# Patient Record
Sex: Male | Born: 1955 | Race: White | Hispanic: No | Marital: Married | Smoking: Never smoker
Health system: Southern US, Community
[De-identification: ages and names within clinical notes are randomized; demographics above are authoritative.]

## PROBLEM LIST (undated history)

## (undated) DIAGNOSIS — F32A Depression, unspecified: Secondary | ICD-10-CM

## (undated) DIAGNOSIS — I7121 Aneurysm of the ascending aorta, without rupture: Secondary | ICD-10-CM

## (undated) DIAGNOSIS — R1032 Left lower quadrant pain: Secondary | ICD-10-CM

## (undated) DIAGNOSIS — F419 Anxiety disorder, unspecified: Secondary | ICD-10-CM

## (undated) DIAGNOSIS — I350 Nonrheumatic aortic (valve) stenosis: Secondary | ICD-10-CM

## (undated) DIAGNOSIS — R972 Elevated prostate specific antigen [PSA]: Secondary | ICD-10-CM

## (undated) DIAGNOSIS — R351 Nocturia: Secondary | ICD-10-CM

## (undated) DIAGNOSIS — Q6589 Other specified congenital deformities of hip: Secondary | ICD-10-CM

## (undated) DIAGNOSIS — M542 Cervicalgia: Secondary | ICD-10-CM

## (undated) DIAGNOSIS — E785 Hyperlipidemia, unspecified: Secondary | ICD-10-CM

## (undated) DIAGNOSIS — E7800 Pure hypercholesterolemia, unspecified: Secondary | ICD-10-CM

## (undated) DIAGNOSIS — D369 Benign neoplasm, unspecified site: Secondary | ICD-10-CM

## (undated) DIAGNOSIS — M1990 Unspecified osteoarthritis, unspecified site: Secondary | ICD-10-CM

## (undated) DIAGNOSIS — I712 Thoracic aortic aneurysm, without rupture: Secondary | ICD-10-CM

## (undated) DIAGNOSIS — L659 Nonscarring hair loss, unspecified: Secondary | ICD-10-CM

## (undated) DIAGNOSIS — R519 Headache, unspecified: Secondary | ICD-10-CM

## (undated) DIAGNOSIS — F329 Major depressive disorder, single episode, unspecified: Secondary | ICD-10-CM

## (undated) DIAGNOSIS — C61 Malignant neoplasm of prostate: Secondary | ICD-10-CM

## (undated) HISTORY — DX: Hyperlipidemia, unspecified: E78.5

## (undated) HISTORY — DX: Nocturia: R35.1

## (undated) HISTORY — PX: WISDOM TOOTH EXTRACTION: SHX21

## (undated) HISTORY — DX: Thoracic aortic aneurysm, without rupture: I71.2

## (undated) HISTORY — DX: Depression, unspecified: F32.A

## (undated) HISTORY — DX: Cervicalgia: M54.2

## (undated) HISTORY — PX: OTHER SURGICAL HISTORY: SHX169

## (undated) HISTORY — DX: Nonrheumatic aortic (valve) stenosis: I35.0

## (undated) HISTORY — DX: Pure hypercholesterolemia, unspecified: E78.00

## (undated) HISTORY — DX: Anxiety disorder, unspecified: F41.9

## (undated) HISTORY — DX: Headache, unspecified: R51.9

## (undated) HISTORY — DX: Left lower quadrant pain: R10.32

## (undated) HISTORY — DX: Unspecified osteoarthritis, unspecified site: M19.90

## (undated) HISTORY — DX: Nonscarring hair loss, unspecified: L65.9

## (undated) HISTORY — DX: Other specified congenital deformities of hip: Q65.89

## (undated) HISTORY — PX: TONSILLECTOMY: SUR1361

## (undated) HISTORY — PX: CERVICAL FUSION: SHX112

## (undated) HISTORY — DX: Aneurysm of the ascending aorta, without rupture: I71.21

## (undated) HISTORY — DX: Benign neoplasm, unspecified site: D36.9

## (undated) HISTORY — PX: SEPTOPLASTY: SUR1290

## (undated) HISTORY — PX: RHINOPLASTY: SUR1284

---
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
## 2007-05-06 ENCOUNTER — Encounter: Admission: RE | Admit: 2007-05-06 | Discharge: 2007-05-06 | Payer: Self-pay | Admitting: Gastroenterology

## 2007-05-12 ENCOUNTER — Ambulatory Visit (HOSPITAL_COMMUNITY): Admission: RE | Admit: 2007-05-12 | Discharge: 2007-05-13 | Payer: Self-pay | Admitting: Neurosurgery

## 2009-08-17 IMAGING — CR DG CERVICAL SPINE 2 OR 3 VIEWS
1 series · 1 of 1 positions shown · non-contrast
Comparison: Cervical spine MRI 05/06/07.

CLINICAL DATA: C6-7 ACDF.
 PORTABLE CERVICAL SPINE ? 2 VIEWS ? 05/12/07:

[view not recorded]
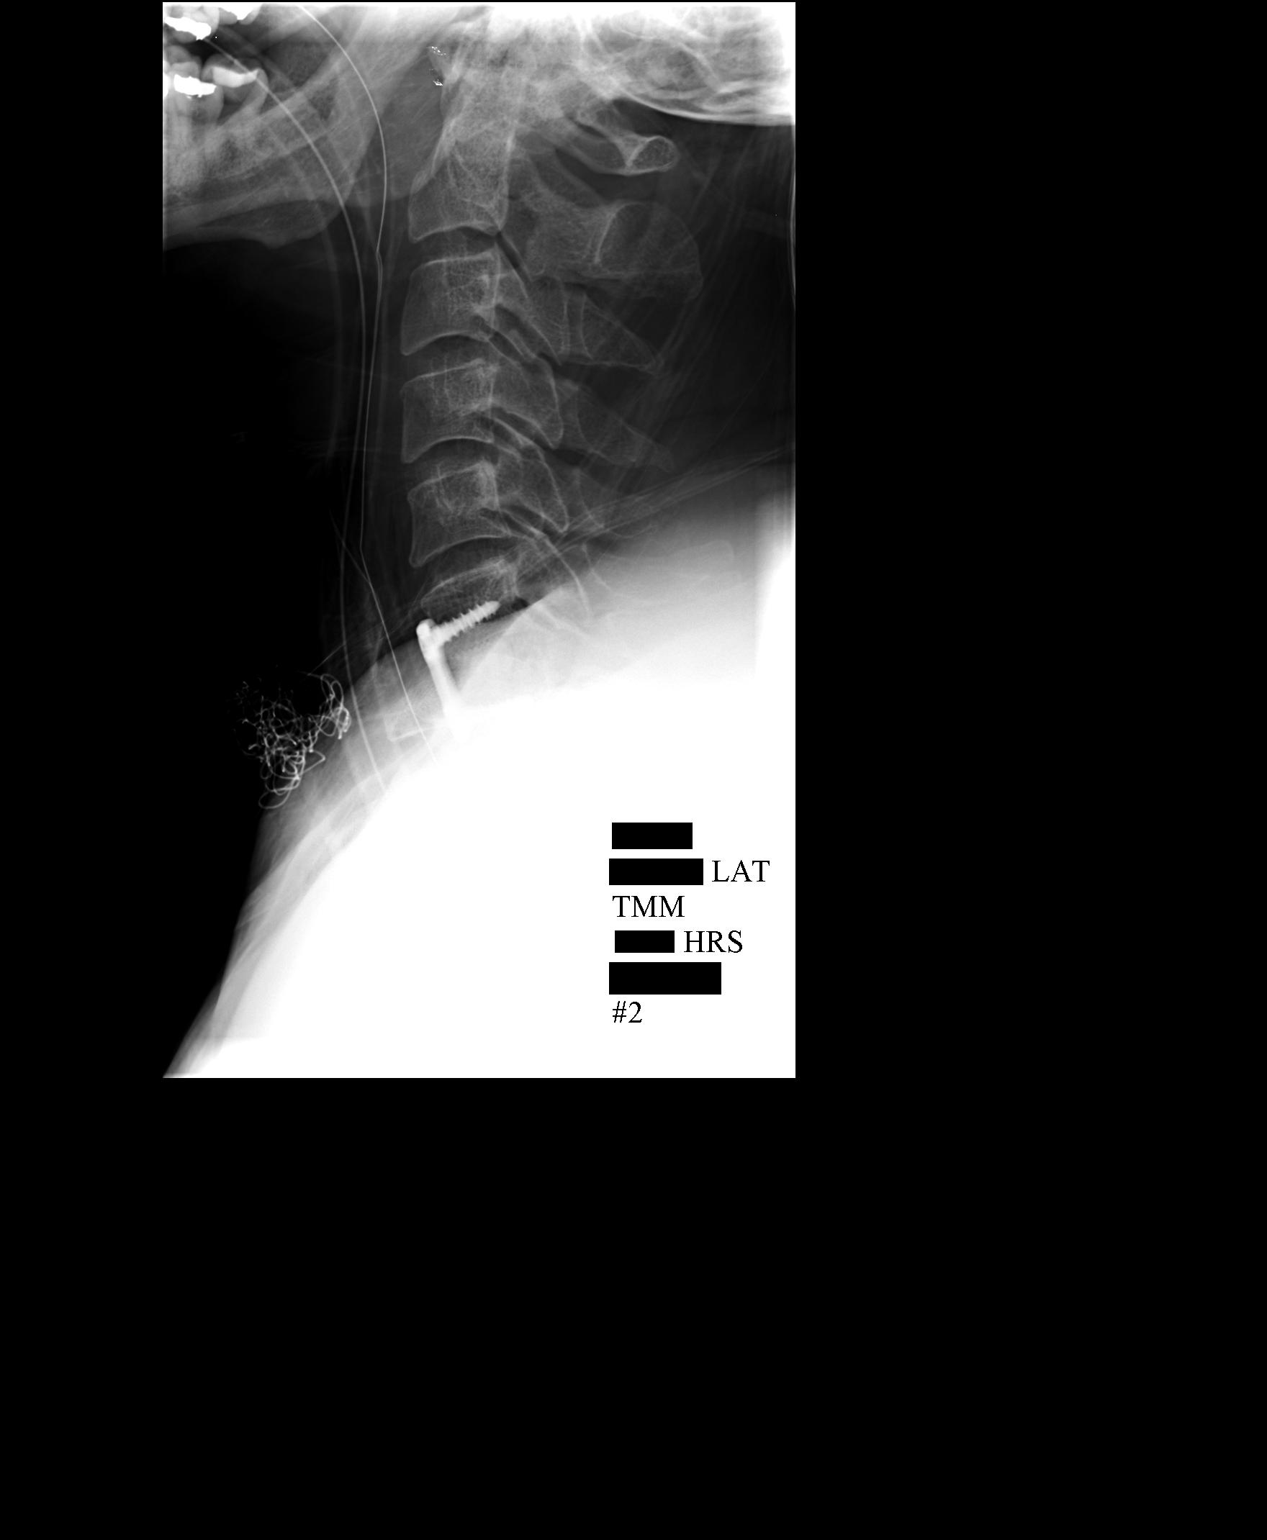

[1 of 1 positions shown; findings below may reference images not displayed]

FINDINGS: Two intraoperative cross-table lateral views of the cervical spine are submitted postoperatively from the operating room.  The initial view demonstrates an anterior localizing needle at the C6-C7 level.  On the second view, the patient has undergone interval anterior diskectomy and fusion at C6-7 with an anterior plate and screws and intervertebral bone plug.  The hardware appears well positioned.  No complications are evident.
IMPRESSION: Intraoperative views during C6-7 ACDF as described.

## 2010-12-05 NOTE — Op Note (Signed)
NAME:  Micheal Powell, Micheal Powell NO.:  [id number]   MEDICAL RECORD NO.:  [id number]          PATIENT TYPE:  AMB   LOCATION:  SDS                          FACILITY:  [facility]   PHYSICIAN:  Clydene Fake, M.D.  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   PROCEDURE:  Anterior cervical decompression and diskectomy and fusion at  C6-7 with __________ allograft bone and Trestle anterior cervical plate.   SURGEON:  Clydene Fake, M.D.   ASSISTANT:  Hewitt Shorts, M.D.   ANESTHESIA:  General endotracheal tube anesthesia.   ESTIMATED BLOOD LOSS:  Minimal.   BLOOD GIVEN:  None.   DRAINS:  None.   COMPLICATIONS:  None.   INDICATIONS FOR PROCEDURE:  The patient is a 55 year old gentleman who  started with neck pain which progressed to severe left shoulder and arm  pain, numbness and weakness radiating into the middle fingers, with  weakness in the triceps despite being on prednisone.  MRI was done, and  the patient had a very large disk herniation on the left side at C6-7,  central and lateral foramen compressing lateral left side of the cord  and C7 nerve root.  The patient brought in for decompression.   PROCEDURE IN DETAIL:  The patient was brought into the operating room.  General anesthesia induced.  The patient was placed in 10 pounds of  halter traction and prepped and draped in a sterile fashion.  The site  of the incision was injected with 10 mL of 1% lidocaine with  epinephrine.   The incision was then made from the midline to the anterior border of  the sternocleidomastoid muscle on the left side.  Neck incision taken  down to the platysma and hemostasis obtained  with the Bovie.  The  platysma was opened with the Bovie and blunt dissection taken through  the anterior cervical fascia to the anterior cervical spine.  A needle  was placed in the interspace.  X-rays were obtained showing the 6/7  interspace.  The disk space was incised with a 15 blade and partial  diskectomy performed.  As the needle was removed, the longus coli  muscles were reflected laterally on each side using the Bovie.  Self-  retaining retractor was then placed.  The microscope was brought in for  microdissection at this point, and curettes and 1 and 2-mm Kerrison  punches were used to continue the diskectomy, removing the posterior  ligament and posterior disk herniation.  There was a significant amount  of free fragments of disk ruptured past the ligament compressing the  cord and out the left foramen.  These were removed with the Kerrison  punches and fished out with the nerve hooks.  We were finished, we had  good decompression of thecal sac and bilateral nerve roots, and they  were both decompressed  out fairly laterally, especially in the left  side.  Hemostasis was obtained with Gelfoam and thrombin.  This was  irrigated out.  We used the high-speed drill to remove cartilaginous  endplate to measure the head of the disk space to be 6 mm.  We again had  good hemostasis, irrigated with antibiotic solution, and a 6-mm  __________ allograft bone was tapped into place and countersunk a couple  of millimeters.  We checked posterior to the graft with a nerve hook,  and there was plenty of room between the bone graft and dura.  We had  placed a Trestle anterior cervical plate over the anterior cervical  spine and placed to two screws into C6 and two into C7.  These were  tightened down.  Lateral x-rays were obtained showing good position of  the plate, screws, bone plug at 6-7 level.  The retractors were removed.  Hemostasis was obtained with Gelfoam and thrombin and bipolar   cauterization.  Gelfoam was irrigated out.  After complete hemostasis,  the platysma was closed with 3-0 Vicryl interrupted sutures.  The  subcutaneous tissues were closed with the same.  The skin was closed  with benzoin and Steri-Strips, and a dressing was placed.   The patient was placed into a soft cervical collar, awakened from  anesthesia, and transferred to the recovery room in stable condition.           ______________________________  Clydene Fake, M.D.     JRH/MEDQ  D:  05/12/2007  T:  05/13/2007  Job:  [id number]

## 2011-05-02 LAB — URINALYSIS, ROUTINE W REFLEX MICROSCOPIC
Glucose, UA: NEGATIVE
Hgb urine dipstick: NEGATIVE
Protein, ur: NEGATIVE
Specific Gravity, Urine: 1.012

## 2011-05-02 LAB — BASIC METABOLIC PANEL
Calcium: 9.9
Creatinine, Ser: 0.89
GFR calc Af Amer: >60
Sodium: 138

## 2011-05-02 LAB — CBC
HCT: 45.7
Hemoglobin: 15.6
MCV: 90.4
RBC: 5.06
WBC: 21.1 — ABNORMAL HIGH

## 2012-07-19 ENCOUNTER — Ambulatory Visit (INDEPENDENT_AMBULATORY_CARE_PROVIDER_SITE_OTHER): Payer: BC Managed Care – PPO | Admitting: Radiology

## 2012-07-19 DIAGNOSIS — Z23 Encounter for immunization: Secondary | ICD-10-CM

## 2013-06-20 ENCOUNTER — Ambulatory Visit (INDEPENDENT_AMBULATORY_CARE_PROVIDER_SITE_OTHER): Payer: BC Managed Care – PPO | Admitting: *Deleted

## 2013-06-20 DIAGNOSIS — Z23 Encounter for immunization: Secondary | ICD-10-CM

## 2013-12-02 ENCOUNTER — Ambulatory Visit (INDEPENDENT_AMBULATORY_CARE_PROVIDER_SITE_OTHER): Payer: BC Managed Care – PPO | Admitting: Emergency Medicine

## 2013-12-02 VITALS — BP 122/80 | HR 74 | Temp 98.6°F | Resp 18 | Ht 74.0 in | Wt 204.0 lb

## 2013-12-02 DIAGNOSIS — J018 Other acute sinusitis: Secondary | ICD-10-CM

## 2013-12-02 DIAGNOSIS — J209 Acute bronchitis, unspecified: Secondary | ICD-10-CM

## 2013-12-02 MED ORDER — AMOXICILLIN-POT CLAVULANATE 875-125 MG PO TABS: 1.0000 | ORAL_TABLET | Freq: Two times a day (BID) | ORAL | Status: DC

## 2013-12-02 MED ORDER — PROMETHAZINE-CODEINE 6.25-10 MG/5ML PO SYRP: 5.0000 mL | ORAL_SOLUTION | Freq: Four times a day (QID) | ORAL | Status: DC | PRN

## 2013-12-02 MED ORDER — PSEUDOEPHEDRINE-GUAIFENESIN ER 60-600 MG PO TB12
1.0000 | ORAL_TABLET | Freq: Two times a day (BID) | ORAL | Status: DC
Start: 2013-12-02 — End: 2014-10-21

## 2013-12-02 NOTE — Progress Notes (Signed)
[facility] [address], [place] [address] [phone]  Date:  12/02/2013   Name:  Micheal Powell   DOB:  21-Feb-1956   MRN:  [id number]  PCP:  No PCP Per Patient    Chief Complaint: Sinusitis, Cough, Headache and Nasal Congestion   History of Present Illness:  Micheal Powell is a 58 y.o. very pleasant male patient who presents with the following:  Ill since last night.  Had fever and pressure in cheeks and forehead.  Has post nasal drainage and cough productive of brown sputum.  No wheezing or shortness of breath.  Headache.  Some nasal congestion with little nasal discharge.  No improvement with over the counter medications or other home remedies. Denies other complaint or health concern today.   There are no active problems to display for this patient.   History reviewed. No pertinent past medical history.  History reviewed. No pertinent past surgical history.  History  Substance Use Topics  . Smoking status: Never Smoker   . Smokeless tobacco: Not on file  . Alcohol Use: Not on file    History reviewed. No pertinent family history.  No Known Allergies  Medication list has been reviewed and updated.  No current outpatient prescriptions on file prior to visit.   No current facility-administered medications on file prior to visit.    Review of Systems:  As per HPI, otherwise negative.    Physical Examination: Filed Vitals:   12/02/13 1133  BP: 122/80  Pulse: 74  Temp: 98.6 F (37 C)  Resp: 18   Filed Vitals:   12/02/13 1133  Height: 6\' 2"  (1.88 m)  Weight: 204 lb (92.534 kg)   Body mass index is 26.18 kg/(m^2). Ideal Body Weight: Weight in (lb) to have BMI = 25: 194.3  GEN: WDWN, NAD, Non-toxic, A & O x 3 HEENT: Atraumatic, Normocephalic. Neck supple. No masses, No LAD. Ears and Nose: No external deformity. CV: RRR, No M/G/R. No JVD. No thrill. No extra heart sounds. PULM: CTA B, no wheezes, crackles, rhonchi. No retractions.  No resp. distress. No accessory muscle use. ABD: S, NT, ND, +BS. No rebound. No HSM. EXTR: No c/c/e NEURO Normal gait.  PSYCH: Normally interactive. Conversant. Not depressed or anxious appearing.  Calm demeanor.    Assessment and Plan: Sinusitis Bronchitis augmentin mucinex d Phen c cod  Signed,  Ellison Carwin, MD

## 2013-12-02 NOTE — Patient Instructions (Signed)

## 2014-10-21 ENCOUNTER — Ambulatory Visit (INDEPENDENT_AMBULATORY_CARE_PROVIDER_SITE_OTHER): Payer: BLUE CROSS/BLUE SHIELD | Admitting: Urgent Care

## 2014-10-21 VITALS — BP 122/68 | HR 72 | Temp 97.7°F | Resp 17 | Ht 73.5 in | Wt 206.8 lb

## 2014-10-21 DIAGNOSIS — M79675 Pain in left toe(s): Secondary | ICD-10-CM | POA: Diagnosis not present

## 2014-10-21 DIAGNOSIS — M2042 Other hammer toe(s) (acquired), left foot: Secondary | ICD-10-CM | POA: Diagnosis not present

## 2014-10-21 DIAGNOSIS — L03032 Cellulitis of left toe: Secondary | ICD-10-CM

## 2014-10-21 MED ORDER — CEPHALEXIN 500 MG PO CAPS
500.0000 mg | ORAL_CAPSULE | Freq: Two times a day (BID) | ORAL | Status: AC
Start: 2014-10-21 — End: 2014-10-28

## 2014-10-21 NOTE — Progress Notes (Signed)
    MRN: [id number] DOB: [birth year]  Subjective:   Micheal Powell is a 59 y.o. male presenting for chief complaint of Toe Pain  Reports 1 week history of worsening 4th left toe pain. Patient reports pain is moderate in severity, achy in nature, worse with walking. Pain does not radiate but has had mild redness and swelling. He has tried Alleve intermittently, also uses this for arthritis; has had some relief with this. Patient admits he is worried this is an ingrown toe nail and is about to travel for about 1 week, would hate for this to get worse. Denies fevers, pus, bleeding, trauma, decreased ROM, decreased sensation. Denies any other aggravating or relieving factors, no other questions or concerns.  Kelsen takes Alleve as needed for arthritis. He has No Known Allergies.  [name]  has a past medical history of Arthritis. Also  has no past surgical history on file.  ROS As in subjective.  Objective:   Vitals: BP 122/68 mmHg  Pulse 72  Temp(Src) 97.7 F (36.5 C) (Oral)  Resp 17  Ht 6' 1.5" (1.867 m)  Wt 206 lb 12.8 oz (93.804 kg)  BMI 26.91 kg/m2  SpO2 99%  Physical Exam  Constitutional: He is oriented to person, place, and time and well-developed, well-nourished, and in no distress.  Cardiovascular: Normal rate.   Pulmonary/Chest: Effort normal.  Musculoskeletal:       Left ankle: He exhibits swelling (trace edema over 4th left toe over lateral border of nail) and deformity (hammer toe of left 4th toe, distal lateral toe actually faces dorsum of foot). He exhibits normal range of motion, no ecchymosis, no laceration and normal pulse. Tenderness (exquisite tenderness of lateral distal aspect of 4th toe).  Neurological: He is alert and oriented to person, place, and time.  Skin: Skin is warm and dry. No rash noted. No erythema. No pallor.   Assessment and Plan :   1. Toe pain, left 2. Hammer toe, left 3. Paronychia, toe, left - Will cover for possible infection, start Keflex BID  x7 days, warm compresses, Tylenol or Alleve for pain - If no improvement in 1 week, call and I will refer to podiatry  Jaynee Eagles, PA-C [facility] [phone] 10/21/2014 10:13 PM

## 2014-10-21 NOTE — Patient Instructions (Addendum)
-   Please take Tylenol or Alleve for your toe pain. If this does not help, I can prescribe Ultram for toe pain - you would have to return to clinic to pick up a script as this is a controlled substance. - If your toe pain does not resolve with warm compresses, antibiotic course, call me and let me know so we can refer you to podiatry for evaluation of hammer toe.  Hammer Toes Hammer toes is a condition in which one or more of your toes is permanently flexed. CAUSES  This happens when a muscle imbalance or abnormal bone length makes your small toes buckle. This causes the toe joint to contract and the strong cord-like bands that attach muscles to the bones (tendons) in your toes to shorten.  SIGNS AND SYMPTOMS  Common symptoms of flexible hammer toes include:   A buildup of skin cells (corns). Corns occur where boney bumps come in frequent contact with hard surfaces. For example, where your shoes press and rub.  Irritation.  Inflammation.  Pain.  Limited motion in your toes. DIAGNOSIS  Hammer toes are diagnosed through a physical exam of your toes. During the exam, your health care provider may try to reproduce your symptoms by manipulating your foot. Often, X-ray exams are done to determine the degree of deformity and to make sure that the cause is not a fracture.  TREATMENT  Hammer toes can be treated with corrective surgery. There are several types of surgical procedures that can treat hammer toes. The most common procedures include:  Arthroplasty--A portion of the joint is surgically removed and your toe is straightened. The gap fills in with fibrous tissue. This procedure helps treat pain and deformity and helps restore function.  Fusion--Cartilage between the two bones of the affected joint is taken out and the bones fuse together into one longer bone. This helps keep your toe stable and reduces pain but leaves your toe stiff, yet straight.  Implantation--A portion of your bone is  removed and replaced with an implant to restore motion.  Flexor tendon transfers--This procedure repositions the tendons that curl the toes down (flexor tendons). This may be done to release the deforming force that causes your toe to buckle. Several of these procedures require fixing your toe with a pin that is visible at the tip of your toe. The pin keeps the toe straight during healing. Your health care provider will remove the pin usually within 4-8 weeks after the procedure.  Document Released: 07/06/2000 Document Revised: 07/14/2013 Document Reviewed: 03/16/2013 [facility] Patient Information 2015 [place], [place]. This information is not intended to replace advice given to you by your health care provider. Make sure you discuss any questions you have with your health care provider.

## 2014-10-21 NOTE — Addendum Note (Signed)
Addended by: Venetia Night on: 10/21/2014 11:14 PM   Modules accepted: Orders

## 2014-10-24 LAB — WOUND CULTURE
Gram Stain: NONE SEEN
Organism ID, Bacteria: NO GROWTH

## 2014-10-25 ENCOUNTER — Encounter: Payer: Self-pay | Admitting: Urgent Care

## 2016-02-21 DIAGNOSIS — L720 Epidermal cyst: Secondary | ICD-10-CM | POA: Diagnosis not present

## 2016-09-25 DIAGNOSIS — Z0000 Encounter for general adult medical examination without abnormal findings: Secondary | ICD-10-CM | POA: Diagnosis not present

## 2016-09-25 DIAGNOSIS — E7800 Pure hypercholesterolemia, unspecified: Secondary | ICD-10-CM | POA: Diagnosis not present

## 2016-09-25 DIAGNOSIS — Z125 Encounter for screening for malignant neoplasm of prostate: Secondary | ICD-10-CM | POA: Diagnosis not present

## 2016-10-15 DIAGNOSIS — L119 Acantholytic disorder, unspecified: Secondary | ICD-10-CM | POA: Diagnosis not present

## 2016-10-15 DIAGNOSIS — D485 Neoplasm of uncertain behavior of skin: Secondary | ICD-10-CM | POA: Diagnosis not present

## 2016-10-15 DIAGNOSIS — L821 Other seborrheic keratosis: Secondary | ICD-10-CM | POA: Diagnosis not present

## 2016-10-15 DIAGNOSIS — D225 Melanocytic nevi of trunk: Secondary | ICD-10-CM | POA: Diagnosis not present

## 2016-10-15 DIAGNOSIS — D2261 Melanocytic nevi of right upper limb, including shoulder: Secondary | ICD-10-CM | POA: Diagnosis not present

## 2016-10-15 DIAGNOSIS — D2272 Melanocytic nevi of left lower limb, including hip: Secondary | ICD-10-CM | POA: Diagnosis not present

## 2016-11-26 DIAGNOSIS — H43811 Vitreous degeneration, right eye: Secondary | ICD-10-CM | POA: Diagnosis not present

## 2016-12-06 DIAGNOSIS — K644 Residual hemorrhoidal skin tags: Secondary | ICD-10-CM | POA: Diagnosis not present

## 2016-12-06 DIAGNOSIS — Z1210 Encounter for screening for malignant neoplasm of intestinal tract, unspecified: Secondary | ICD-10-CM | POA: Diagnosis not present

## 2016-12-20 DIAGNOSIS — D126 Benign neoplasm of colon, unspecified: Secondary | ICD-10-CM | POA: Diagnosis not present

## 2016-12-20 DIAGNOSIS — Z1211 Encounter for screening for malignant neoplasm of colon: Secondary | ICD-10-CM | POA: Diagnosis not present

## 2016-12-24 DIAGNOSIS — H5203 Hypermetropia, bilateral: Secondary | ICD-10-CM | POA: Diagnosis not present

## 2016-12-24 DIAGNOSIS — H43813 Vitreous degeneration, bilateral: Secondary | ICD-10-CM | POA: Diagnosis not present

## 2016-12-25 DIAGNOSIS — D126 Benign neoplasm of colon, unspecified: Secondary | ICD-10-CM | POA: Diagnosis not present

## 2017-01-11 DIAGNOSIS — H02844 Edema of left upper eyelid: Secondary | ICD-10-CM | POA: Diagnosis not present

## 2017-01-11 DIAGNOSIS — H02845 Edema of left lower eyelid: Secondary | ICD-10-CM | POA: Diagnosis not present

## 2017-01-22 DIAGNOSIS — L309 Dermatitis, unspecified: Secondary | ICD-10-CM | POA: Diagnosis not present

## 2017-02-05 DIAGNOSIS — H10413 Chronic giant papillary conjunctivitis, bilateral: Secondary | ICD-10-CM | POA: Diagnosis not present

## 2017-03-12 DIAGNOSIS — H10413 Chronic giant papillary conjunctivitis, bilateral: Secondary | ICD-10-CM | POA: Diagnosis not present

## 2017-05-15 DIAGNOSIS — Z23 Encounter for immunization: Secondary | ICD-10-CM | POA: Diagnosis not present

## 2017-10-03 DIAGNOSIS — Z131 Encounter for screening for diabetes mellitus: Secondary | ICD-10-CM | POA: Diagnosis not present

## 2017-10-03 DIAGNOSIS — Z0000 Encounter for general adult medical examination without abnormal findings: Secondary | ICD-10-CM | POA: Diagnosis not present

## 2017-10-03 DIAGNOSIS — E7800 Pure hypercholesterolemia, unspecified: Secondary | ICD-10-CM | POA: Diagnosis not present

## 2017-10-17 DIAGNOSIS — L2089 Other atopic dermatitis: Secondary | ICD-10-CM | POA: Diagnosis not present

## 2017-10-17 DIAGNOSIS — L821 Other seborrheic keratosis: Secondary | ICD-10-CM | POA: Diagnosis not present

## 2017-10-17 DIAGNOSIS — D225 Melanocytic nevi of trunk: Secondary | ICD-10-CM | POA: Diagnosis not present

## 2017-10-17 DIAGNOSIS — L720 Epidermal cyst: Secondary | ICD-10-CM | POA: Diagnosis not present

## 2017-12-26 DIAGNOSIS — H2513 Age-related nuclear cataract, bilateral: Secondary | ICD-10-CM | POA: Diagnosis not present

## 2017-12-26 DIAGNOSIS — H5203 Hypermetropia, bilateral: Secondary | ICD-10-CM | POA: Diagnosis not present

## 2018-04-10 DIAGNOSIS — Z23 Encounter for immunization: Secondary | ICD-10-CM | POA: Diagnosis not present

## 2018-10-20 DIAGNOSIS — D225 Melanocytic nevi of trunk: Secondary | ICD-10-CM | POA: Diagnosis not present

## 2018-10-20 DIAGNOSIS — D2262 Melanocytic nevi of left upper limb, including shoulder: Secondary | ICD-10-CM | POA: Diagnosis not present

## 2018-10-20 DIAGNOSIS — D2261 Melanocytic nevi of right upper limb, including shoulder: Secondary | ICD-10-CM | POA: Diagnosis not present

## 2018-10-20 DIAGNOSIS — L821 Other seborrheic keratosis: Secondary | ICD-10-CM | POA: Diagnosis not present

## 2019-01-01 DIAGNOSIS — H2513 Age-related nuclear cataract, bilateral: Secondary | ICD-10-CM | POA: Diagnosis not present

## 2019-01-01 DIAGNOSIS — H04123 Dry eye syndrome of bilateral lacrimal glands: Secondary | ICD-10-CM | POA: Diagnosis not present

## 2019-01-01 DIAGNOSIS — H524 Presbyopia: Secondary | ICD-10-CM | POA: Diagnosis not present

## 2019-01-09 ENCOUNTER — Other Ambulatory Visit (HOSPITAL_COMMUNITY): Payer: Self-pay | Admitting: Internal Medicine

## 2019-01-09 DIAGNOSIS — Z1159 Encounter for screening for other viral diseases: Secondary | ICD-10-CM | POA: Diagnosis not present

## 2019-01-09 DIAGNOSIS — Z23 Encounter for immunization: Secondary | ICD-10-CM | POA: Diagnosis not present

## 2019-01-09 DIAGNOSIS — Z125 Encounter for screening for malignant neoplasm of prostate: Secondary | ICD-10-CM | POA: Diagnosis not present

## 2019-01-09 DIAGNOSIS — R011 Cardiac murmur, unspecified: Secondary | ICD-10-CM | POA: Diagnosis not present

## 2019-01-09 DIAGNOSIS — E7800 Pure hypercholesterolemia, unspecified: Secondary | ICD-10-CM | POA: Diagnosis not present

## 2019-01-09 DIAGNOSIS — Z0000 Encounter for general adult medical examination without abnormal findings: Secondary | ICD-10-CM | POA: Diagnosis not present

## 2019-01-12 ENCOUNTER — Telehealth (HOSPITAL_COMMUNITY): Payer: Self-pay

## 2019-01-12 NOTE — Telephone Encounter (Signed)

## 2019-01-13 ENCOUNTER — Other Ambulatory Visit: Payer: Self-pay

## 2019-01-13 ENCOUNTER — Ambulatory Visit (HOSPITAL_COMMUNITY): Payer: BC Managed Care – PPO | Attending: Internal Medicine

## 2019-01-13 ENCOUNTER — Encounter (INDEPENDENT_AMBULATORY_CARE_PROVIDER_SITE_OTHER): Payer: Self-pay

## 2019-01-13 DIAGNOSIS — R011 Cardiac murmur, unspecified: Secondary | ICD-10-CM | POA: Diagnosis not present

## 2019-01-14 ENCOUNTER — Other Ambulatory Visit: Payer: Self-pay | Admitting: Internal Medicine

## 2019-01-14 DIAGNOSIS — E7800 Pure hypercholesterolemia, unspecified: Secondary | ICD-10-CM

## 2019-01-22 ENCOUNTER — Telehealth: Payer: Self-pay

## 2019-01-22 NOTE — Telephone Encounter (Signed)
NOTES ON FILE FROM [facility] [facility] [phone], SENT REFERRAL TO [facility]

## 2019-01-26 ENCOUNTER — Other Ambulatory Visit (HOSPITAL_COMMUNITY): Payer: Self-pay | Admitting: Internal Medicine

## 2019-01-26 DIAGNOSIS — R011 Cardiac murmur, unspecified: Secondary | ICD-10-CM

## 2019-02-09 ENCOUNTER — Ambulatory Visit
Admission: RE | Admit: 2019-02-09 | Discharge: 2019-02-09 | Disposition: A | Discharge status: Home / Self Care | Payer: No Typology Code available for payment source | Source: Ambulatory Visit | Attending: Internal Medicine | Admitting: Internal Medicine

## 2019-02-09 ENCOUNTER — Other Ambulatory Visit: Payer: Self-pay

## 2019-02-09 DIAGNOSIS — E7800 Pure hypercholesterolemia, unspecified: Secondary | ICD-10-CM

## 2019-03-13 DIAGNOSIS — Z23 Encounter for immunization: Secondary | ICD-10-CM | POA: Diagnosis not present

## 2019-04-14 DIAGNOSIS — E7800 Pure hypercholesterolemia, unspecified: Secondary | ICD-10-CM | POA: Diagnosis not present

## 2019-04-14 DIAGNOSIS — Z5181 Encounter for therapeutic drug level monitoring: Secondary | ICD-10-CM | POA: Diagnosis not present

## 2019-04-22 ENCOUNTER — Ambulatory Visit (INDEPENDENT_AMBULATORY_CARE_PROVIDER_SITE_OTHER): Payer: BC Managed Care – PPO | Admitting: Cardiology

## 2019-04-22 ENCOUNTER — Other Ambulatory Visit: Payer: Self-pay

## 2019-04-22 ENCOUNTER — Encounter: Payer: Self-pay | Admitting: Cardiology

## 2019-04-22 VITALS — BP 122/70 | HR 65 | Ht 73.5 in | Wt 183.2 lb

## 2019-04-22 DIAGNOSIS — Q231 Congenital insufficiency of aortic valve: Secondary | ICD-10-CM | POA: Diagnosis not present

## 2019-04-22 DIAGNOSIS — I77810 Thoracic aortic ectasia: Secondary | ICD-10-CM

## 2019-04-22 DIAGNOSIS — E7800 Pure hypercholesterolemia, unspecified: Secondary | ICD-10-CM
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
## 2019-04-22 NOTE — Patient Instructions (Signed)
Medication Instructions:  No changes If you need a refill on your cardiac medications before your next appointment, please call your pharmacy.   Lab work: none If you have labs (blood work) drawn today and your tests are completely normal, you will receive your results only by: Marland Kitchen MyChart Message (if you have MyChart) OR . A paper copy in the mail If you have any lab test that is abnormal or we need to change your treatment, we will call you to review the results.  Testing/Procedures: Your physician has requested that you have an echocardiogram. Echocardiography is a painless test that uses sound waves to create images of your heart. It provides your doctor with information about the size and shape of your heart and how well your heart's chambers and valves are working. This procedure takes approximately one hour. There are no restrictions for this procedure. (THIS IS DUE IN ONE YEAR --SEPT 2021)   Follow-Up: At [facility], you and your health needs are our priority.  As part of our continuing mission to provide you with exceptional heart care, we have created designated Provider Care Teams.  These Care Teams include your primary Cardiologist (physician) and Advanced Practice Providers (APPs -  Physician Assistants and Nurse Practitioners) who all work together to provide you with the care you need, when you need it. You will need a follow up appointment in 12 months.  Please call our office 2 months in advance to schedule this appointment.  You may see Candee Furbish, MD or one of the following Advanced Practice Providers on your designated Care Team:   Truitt Merle, NP Cecilie Kicks, NP . Kathyrn Drown, NP  Any Other Special Instructions Will Be Listed Below (If Applicable).

## 2019-04-24 DIAGNOSIS — M940 Chondrocostal junction syndrome [Tietze]: Secondary | ICD-10-CM | POA: Diagnosis not present

## 2019-04-24 DIAGNOSIS — Z23 Encounter for immunization: Secondary | ICD-10-CM | POA: Diagnosis not present

## 2019-10-06 DIAGNOSIS — D2261 Melanocytic nevi of right upper limb, including shoulder: Secondary | ICD-10-CM | POA: Diagnosis not present

## 2019-10-06 DIAGNOSIS — D225 Melanocytic nevi of trunk: Secondary | ICD-10-CM | POA: Diagnosis not present

## 2019-10-06 DIAGNOSIS — L814 Other melanin hyperpigmentation: Secondary | ICD-10-CM | POA: Diagnosis not present

## 2019-10-06 DIAGNOSIS — D1801 Hemangioma of skin and subcutaneous tissue: Secondary | ICD-10-CM | POA: Diagnosis not present

## 2019-10-20 DIAGNOSIS — L720 Epidermal cyst: Secondary | ICD-10-CM | POA: Diagnosis not present

## 2019-11-11 DIAGNOSIS — L308 Other specified dermatitis: Secondary | ICD-10-CM | POA: Diagnosis not present

## 2020-01-07 DIAGNOSIS — H2513 Age-related nuclear cataract, bilateral: Secondary | ICD-10-CM | POA: Diagnosis not present

## 2020-01-11 DIAGNOSIS — E7800 Pure hypercholesterolemia, unspecified: Secondary | ICD-10-CM | POA: Diagnosis not present

## 2020-01-11 DIAGNOSIS — Z0000 Encounter for general adult medical examination without abnormal findings: Secondary | ICD-10-CM | POA: Diagnosis not present

## 2020-01-11 DIAGNOSIS — I350 Nonrheumatic aortic (valve) stenosis: Secondary | ICD-10-CM | POA: Diagnosis not present

## 2020-01-11 DIAGNOSIS — I77810 Thoracic aortic ectasia: Secondary | ICD-10-CM | POA: Diagnosis not present

## 2020-04-21 ENCOUNTER — Telehealth (HOSPITAL_COMMUNITY): Payer: Self-pay | Admitting: Cardiology

## 2020-04-21 NOTE — Telephone Encounter (Signed)
I called patient to schedule 1 year echocardiogram and he states he is out of town and not able to schedule at this time and will call us back. Order will be removed from the [facility] and when he calls back we will reinstate the order. Thank you.

## 2020-05-04 DIAGNOSIS — Z23 Encounter for immunization: Secondary | ICD-10-CM | POA: Diagnosis not present

## 2020-10-25 DIAGNOSIS — D2262 Melanocytic nevi of left upper limb, including shoulder: Secondary | ICD-10-CM | POA: Diagnosis not present

## 2020-10-25 DIAGNOSIS — L2089 Other atopic dermatitis: Secondary | ICD-10-CM | POA: Diagnosis not present

## 2020-10-25 DIAGNOSIS — D2261 Melanocytic nevi of right upper limb, including shoulder: Secondary | ICD-10-CM | POA: Diagnosis not present

## 2020-10-25 DIAGNOSIS — L820 Inflamed seborrheic keratosis: Secondary | ICD-10-CM | POA: Diagnosis not present

## 2020-10-25 DIAGNOSIS — D2372 Other benign neoplasm of skin of left lower limb, including hip: Secondary | ICD-10-CM | POA: Diagnosis not present

## 2020-10-25 DIAGNOSIS — D225 Melanocytic nevi of trunk: Secondary | ICD-10-CM | POA: Diagnosis not present

## 2020-10-25 DIAGNOSIS — D485 Neoplasm of uncertain behavior of skin: Secondary | ICD-10-CM | POA: Diagnosis not present

## 2021-01-10 DIAGNOSIS — Z125 Encounter for screening for malignant neoplasm of prostate: Secondary | ICD-10-CM | POA: Diagnosis not present

## 2021-01-10 DIAGNOSIS — E7800 Pure hypercholesterolemia, unspecified: Secondary | ICD-10-CM | POA: Diagnosis not present

## 2021-01-10 DIAGNOSIS — I77810 Thoracic aortic ectasia: Secondary | ICD-10-CM | POA: Diagnosis not present

## 2021-01-10 DIAGNOSIS — I350 Nonrheumatic aortic (valve) stenosis: Secondary | ICD-10-CM | POA: Diagnosis not present

## 2021-01-10 DIAGNOSIS — Z0000 Encounter for general adult medical examination without abnormal findings: Secondary | ICD-10-CM | POA: Diagnosis not present

## 2021-01-10 DIAGNOSIS — H2513 Age-related nuclear cataract, bilateral: Secondary | ICD-10-CM | POA: Diagnosis not present

## 2021-01-10 DIAGNOSIS — Z5181 Encounter for therapeutic drug level monitoring: Secondary | ICD-10-CM | POA: Diagnosis not present

## 2021-03-02 DIAGNOSIS — M79644 Pain in right finger(s): Secondary | ICD-10-CM | POA: Diagnosis not present

## 2021-03-02 DIAGNOSIS — M778 Other enthesopathies, not elsewhere classified: Secondary | ICD-10-CM | POA: Diagnosis not present

## 2021-03-09 DIAGNOSIS — M65331 Trigger finger, right middle finger: Secondary | ICD-10-CM | POA: Diagnosis not present

## 2021-04-11 DIAGNOSIS — M65331 Trigger finger, right middle finger: Secondary | ICD-10-CM | POA: Diagnosis not present

## 2021-05-09 ENCOUNTER — Ambulatory Visit: Payer: BC Managed Care – PPO | Admitting: Cardiology

## 2021-05-15 ENCOUNTER — Ambulatory Visit (HOSPITAL_BASED_OUTPATIENT_CLINIC_OR_DEPARTMENT_OTHER): Payer: BC Managed Care – PPO | Admitting: Cardiology

## 2021-05-24 ENCOUNTER — Ambulatory Visit (INDEPENDENT_AMBULATORY_CARE_PROVIDER_SITE_OTHER): Payer: No Typology Code available for payment source | Admitting: Cardiology

## 2021-05-24 ENCOUNTER — Other Ambulatory Visit: Payer: Self-pay

## 2021-05-24 ENCOUNTER — Encounter (HOSPITAL_BASED_OUTPATIENT_CLINIC_OR_DEPARTMENT_OTHER): Payer: Self-pay | Admitting: Cardiology

## 2021-05-24 VITALS — BP 120/70 | HR 61 | Ht 73.5 in | Wt 190.0 lb

## 2021-05-24 DIAGNOSIS — I77810 Thoracic aortic ectasia: Secondary | ICD-10-CM

## 2021-05-24 DIAGNOSIS — I2510 Atherosclerotic heart disease of native coronary artery without angina pectoris: Secondary | ICD-10-CM | POA: Diagnosis not present

## 2021-05-24 DIAGNOSIS — E7800 Pure hypercholesterolemia, unspecified: Secondary | ICD-10-CM

## 2021-05-24 DIAGNOSIS — R011 Cardiac murmur, unspecified: Secondary | ICD-10-CM | POA: Diagnosis not present

## 2021-05-24 DIAGNOSIS — R0989 Other specified symptoms and signs involving the circulatory and respiratory systems: Secondary | ICD-10-CM

## 2021-05-24 DIAGNOSIS — Q231 Congenital insufficiency of aortic valve: Secondary | ICD-10-CM | POA: Diagnosis not present
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
## 2021-05-24 NOTE — Patient Instructions (Signed)
Medication Instructions:  The current medical regimen is effective;  continue present plan and medications.  *If you need a refill on your cardiac medications before your next appointment, please call your pharmacy*  Testing/Procedures: Your physician has requested that you have a carotid duplex. This test is an ultrasound of the carotid arteries in your neck. It looks at blood flow through these arteries that supply the brain with blood. Allow one hour for this exam. There are no restrictions or special instructions.  Your physician has requested that you have an echocardiogram. Echocardiography is a painless test that uses sound waves to create images of your heart. It provides your doctor with information about the size and shape of your heart and how well your heart's chambers and valves are working. This procedure takes approximately one hour. There are no restrictions for this procedure.  Follow-Up: At [facility], you and your health needs are our priority.  As part of our continuing mission to provide you with exceptional heart care, we have created designated Provider Care Teams.  These Care Teams include your primary Cardiologist (physician) and Advanced Practice Providers (APPs -  Physician Assistants and Nurse Practitioners) who all work together to provide you with the care you need, when you need it.  We recommend signing up for the patient portal called "MyChart".  Sign up information is provided on this After Visit Summary.  MyChart is used to connect with patients for Virtual Visits (Telemedicine).  Patients are able to view lab/test results, encounter notes, upcoming appointments, etc.  Non-urgent messages can be sent to your provider as well.   To learn more about what you can do with MyChart, go to [email or web address].    Your next appointment:   1 year(s)  The format for your next appointment:   In Person  Provider:   Candee Furbish, MD   Thank you for choosing [facility]!!

## 2021-05-24 NOTE — Assessment & Plan Note (Signed)
Previously measured between 4.2 and 4.5 cm.  4.2 cm is what the coronary calcium score CT scan showed.  Mildly dilated.  Continuing with good blood pressure control, statin therapy.  We will go ahead and repeat echocardiogram.

## 2021-05-24 NOTE — Assessment & Plan Note (Signed)
Repeating echocardiogram.  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  Genetic condition.  Previously we discussed potentially having his offspring checked.  Look for any signs of progression.

## 2021-05-24 NOTE — Assessment & Plan Note (Signed)
Checking carotid Dopplers.  Could be propagation of aortic valve murmur.

## 2021-05-24 NOTE — Assessment & Plan Note (Signed)
Doing well with atorvastatin 10 mg once a day low level dose with no myalgias.  Most recent LDL 54 in June 2022.  Creatinine 1.02 ALT 15.  Excellent.

## 2021-05-24 NOTE — Assessment & Plan Note (Signed)
Prior coronary calcium score noted LAD calcification score 73 in 2020.  Continue with atorvastatin.  Excellent LDL in the 50s.  Could consider increasing dose however his LDL is excellent.

## 2021-06-01 ENCOUNTER — Ambulatory Visit (INDEPENDENT_AMBULATORY_CARE_PROVIDER_SITE_OTHER): Payer: No Typology Code available for payment source

## 2021-06-01 ENCOUNTER — Other Ambulatory Visit: Payer: Self-pay

## 2021-06-01 DIAGNOSIS — R011 Cardiac murmur, unspecified: Secondary | ICD-10-CM

## 2021-06-01 DIAGNOSIS — R0989 Other specified symptoms and signs involving the circulatory and respiratory systems: Secondary | ICD-10-CM

## 2021-06-01 DIAGNOSIS — Q231 Congenital insufficiency of aortic valve: Secondary | ICD-10-CM | POA: Diagnosis not present

## 2021-06-01 LAB — ECHOCARDIOGRAM COMPLETE
AR max vel: 0.92 cm2
AV Area VTI: 0.85 cm2
AV Area mean vel: 0.83 cm2
AV Mean grad: 23 mmHg
AV Peak grad: 43.2 mmHg
AV Vena cont: 0.32 cm
Ao pk vel: 3.29 m/s
Calc EF: 67.8 %
P 1/2 time: 488 msec
S' Lateral: 3.14 cm
Single Plane A2C EF: 70.3 %
Single Plane A4C EF: 70.2 %

## 2021-07-12 ENCOUNTER — Other Ambulatory Visit (HOSPITAL_BASED_OUTPATIENT_CLINIC_OR_DEPARTMENT_OTHER): Payer: Self-pay | Admitting: Cardiology

## 2021-07-12 DIAGNOSIS — I779 Disorder of arteries and arterioles, unspecified: Secondary | ICD-10-CM

## 2022-01-17 ENCOUNTER — Telehealth: Payer: Self-pay | Admitting: Cardiology

## 2022-01-17 NOTE — Telephone Encounter (Signed)
Pt's PCP, Dr. Lavone Orn, called requesting a provider switch from Dr Marlou Porch to Dr. Burt Knack. He asked that Dr. Burt Knack give him a call at the number below    Dr.John Laurann Montana [phone]

## 2022-01-29 NOTE — Telephone Encounter (Signed)
Sherren Mocha, MD  You; Jerline Pain, MD; Emeline Darling R 2 days ago    I talked to Dr Laurann Montana. 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 I'm fine to see him. Judson Roch can you set him up for an appt in 3 months? thx    Jerline Pain, MD  Jesusita Oka, MD 10 days ago    OK with me.  Candee Furbish, MD   Called and left message for patient. Scheduled with Dr Burt Knack for Tuesday, May 01 2022 @ 3:00pm. Asked that pt call back if this time needs to be changed.

## 2022-05-01 ENCOUNTER — Ambulatory Visit
Payer: No Typology Code available for payment source | Attending: Cardiovascular Disease | Admitting: Cardiovascular Disease

## 2022-05-01 ENCOUNTER — Encounter: Payer: Self-pay | Admitting: Cardiovascular Disease

## 2022-05-01 VITALS — BP 130/78 | HR 77 | Ht 73.0 in | Wt 191.4 lb

## 2022-05-01 DIAGNOSIS — I2510 Atherosclerotic heart disease of native coronary artery without angina pectoris: Secondary | ICD-10-CM | POA: Insufficient documentation

## 2022-05-01 DIAGNOSIS — E7800 Pure hypercholesterolemia, unspecified: Secondary | ICD-10-CM | POA: Diagnosis not present

## 2022-05-01 DIAGNOSIS — Q231 Congenital insufficiency of aortic valve: Secondary | ICD-10-CM | POA: Diagnosis not present

## 2022-05-01 DIAGNOSIS — I77810 Thoracic aortic ectasia: Secondary | ICD-10-CM | POA: Insufficient documentation

## 2022-05-01 DIAGNOSIS — Z01810 Encounter for preprocedural cardiovascular examination: Secondary | ICD-10-CM | POA: Diagnosis present

## 2022-05-01 NOTE — Patient Instructions (Signed)
Medication Instructions:  NONE *If you need a refill on your cardiac medications before your next appointment, please call your pharmacy*   Lab Work: BMET today If you have labs (blood work) drawn today and your tests are completely normal, you will receive your results only by: [place] (if you have MyChart) OR A paper copy in the mail If you have any lab test that is abnormal or we need to change your treatment, we will call you to review the results.   Testing/Procedures: ECHO Your physician has requested that you have an echocardiogram. Echocardiography is a painless test that uses sound waves to create images of your heart. It provides your doctor with information about the size and shape of your heart and how well your heart's chambers and valves are working. This procedure takes approximately one hour. There are no restrictions for this procedure.  CTA chest/Aorta Your physician has requested that you have cardiac CT. Cardiac computed tomography (CT) is a painless test that uses an x-ray machine to take clear, detailed pictures of your heart. For further information please visit [email or web address]. Please follow instruction sheet as given.  Follow-Up: At [facility], you and your health needs are our priority.  As part of our continuing mission to provide you with exceptional heart care, we have created designated Provider Care Teams.  These Care Teams include your primary Cardiologist (physician) and Advanced Practice Providers (APPs -  Physician Assistants and Nurse Practitioners) who all work together to provide you with the care you need, when you need it.  We recommend signing up for the patient portal called "MyChart".  Sign up information is provided on this After Visit Summary.  MyChart is used to connect with patients for Virtual Visits (Telemedicine).  Patients are able to view lab/test results, encounter notes, upcoming appointments, etc.  Non-urgent  messages can be sent to your provider as well.   To learn more about what you can do with MyChart, go to [email or web address].    Your next appointment:   1 year(s)  The format for your next appointment:   In Person  Provider:   Legrand Como Cooper,MD    Important Information About [place]

## 2022-05-01 NOTE — Progress Notes (Signed)
Cardiology Office Note:    Date:  05/08/2022   ID:  Micheal Powell, DOB [birth year], MRN [id number]  PCP:  Kathalene Frames, MD   [place] [facility] Cardiologist:  Sherren Mocha, MD     Referring MD: Lavone Orn, MD   Chief Complaint  Patient presents with   Aortic Stenosis    History of Present Illness:    Micheal Powell is a 66 y.o. male presenting for follow-up of bicuspid aortic valve stenosis, dilated aortic root, and mixed hyperlipidemia.  An echocardiogram in June 2000 showed normal LVEF of 60 to 65%, normal RV function, and mild aortic stenosis with a mean transvalvular gradient of 16 mmHg, calculated valve area of 2.1 cm, and moderately dilated ascending aorta of 4.5 cm.  A follow-up echocardiogram in November 2022 showed a modest increase in mean gradient of 23 mmHg but dimensionless index now of 0.25 and aortic valve area of 0.9 cm as well as mild aortic insufficiency.  LVEF remained normal at 60 to 65% and the size of the ascending aorta is stable at 45 mm.  The patient presents today for follow-up of his aortic stenosis.  This is his first visit with me.  He is here alone today. He was diagnosed with a heart murmur in 2020. He has a first cousin who died suddenly at 46 and his father also died suddenly at 33. Both were physically-fit and had no known hx of heart disease. He gets an ache in his chest when he feels dehydrated and occasionally with extreme stress. When he's hydrated, he states these symptoms completely resolve. He otherwise has no symptoms with physical exertion and specifically denies exertional chest pain, chest pressure, dyspnea, palpitations, or lightheadedness.   Past Medical History:  Diagnosis Date   Abdominal pain, left lower quadrant    Anxiety    Aortic stenosis    Arthritis    Ascending aortic aneurysm (HCC)    dilation'   Depression    Dyslipidemia    Dysplasia of hip    Frontal headache    Hair loss     Hypercholesterolemia    Neck pain on left side    Nocturia    Tubular adenoma     Past Surgical History:  Procedure Laterality Date   CERVICAL FUSION     dental implant     RHINOPLASTY     SEPTOPLASTY     skin lump removal     TONSILLECTOMY     WISDOM TOOTH EXTRACTION      Current Medications: Current Meds  Medication Sig   aspirin EC 81 MG tablet Take 81 mg by mouth daily.   atorvastatin (LIPITOR) 10 MG tablet Take 10 mg by mouth daily.   augmented betamethasone dipropionate (DIPROLENE-AF) 0.05 % ointment Apply topically as needed.   desonide (DESOWEN) 0.05 % ointment 2 (two) times daily as needed.   finasteride (PROPECIA) 1 MG tablet Take 1 mg by mouth daily.   Multiple Vitamin (MULTIVITAMIN) tablet Take 1 tablet by mouth daily.   Multiple Vitamins-Minerals (AIRBORNE PO) Take by mouth.   mupirocin ointment (BACTROBAN) 2 % Apply 1 Application topically 2 (two) times daily.   triamcinolone cream (KENALOG) 0.1 % Apply 1 Application topically as needed.     Allergies:   Patient has no known allergies.   Social History   Socioeconomic History   Marital status: Married    Spouse name: Not on file   Number of children: Not on file   Years of  education: Not on file   Highest education level: Not on file  Occupational History   Not on file  Tobacco Use   Smoking status: Never   Smokeless tobacco: Never  Substance and Sexual Activity   Alcohol use: Not on file   Drug use: Not on file   Sexual activity: Not on file  Other Topics Concern   Not on file  Social History Narrative   Not on file   Social Determinants of Health   Financial Resource Strain: Not on file  Food Insecurity: Not on file  Transportation Needs: Not on file  Physical Activity: Not on file  Stress: Not on file  Social Connections: Not on file     Family History: The patient's family history includes Alcoholism in his father; Depression in his mother; Heart Problems in his mother; Osteoporosis  in his mother; Other in his brother, father, and sister; Post-traumatic stress disorder in his mother; Sinusitis in his mother.  ROS:   Please see the history of present illness.    All other systems reviewed and are negative.  EKGs/Labs/Other Studies Reviewed:    The following studies were reviewed today: Echo 06/01/2021: 1. Calcified aortic valve with moderate to severe AS (mean gradient 23  mmHg; AVA 0.9 cm2; DI 0.25); mild AI.   2. Left ventricular ejection fraction, by estimation, is 60 to 65%. The  left ventricle has normal function. The left ventricle has no regional  wall motion abnormalities. There is mild left ventricular hypertrophy.  Left ventricular diastolic parameters  are indeterminate. The average left ventricular global longitudinal strain  is -16.6 %. The global longitudinal strain is normal.   3. Right ventricular systolic function is normal. The right ventricular  size is normal.   4. The mitral valve is normal in structure. No evidence of mitral valve  regurgitation. No evidence of mitral stenosis.   5. The aortic valve is calcified. Aortic valve regurgitation is mild.  Moderate to severe aortic valve stenosis.   6. Aortic dilatation noted. There is moderate dilatation of the ascending  aorta, measuring 45 mm.   7. The inferior vena cava is dilated in size with >50% respiratory  variability, suggesting right atrial pressure of 8 mmHg.   Comparison(s): EF 60%, ascending aorta 42 mm, mild AS peak 30.3 mmHg mean  16 mmHg.   Coronary Calcium CT 02/09/2019: IMPRESSION: The observed calcium score of 73.5 is at the percentile 57 for subjects of the same age, gender and race/ethnicity who are free of clinical cardiovascular disease and treated diabetes.   Aortic valve calcifications.   4.1 cm ascending thoracic aortic aneurysm. Recommend annual imaging followup by CTA or MRA. This recommendation follows 2010 ACCF/AHA/AATS/ACR/ASA/SCA/SCAI/SIR/STS/SVM Guidelines  for the Diagnosis and Management of Patients with Thoracic Aortic Disease. Circulation. 2010; 121: T245-Y099. Aortic aneurysm NOS (ICD10-I71.9)  EKG:  EKG is ordered today.  The ekg ordered today demonstrates normal sinus rhythm 78 bpm, right atrial enlargement, nonspecific ST abnormality.  Recent Labs: 05/01/2022: BUN 12; Creatinine, Ser 0.85; Potassium 4.9; Sodium 139  Recent Lipid Panel No results found for: "CHOL", "TRIG", "HDL", "CHOLHDL", "VLDL", "LDLCALC", "LDLDIRECT"   Risk Assessment/Calculations:        Physical Exam:    VS:  BP 130/78   Pulse 77   Ht '6\' 1"'$  (1.854 m)   Wt 191 lb 6.4 oz (86.8 kg)   SpO2 95%   BMI 25.25 kg/m     Wt Readings from Last 3 Encounters:  05/01/22 191 lb  6.4 oz (86.8 kg)  05/24/21 190 lb (86.2 kg)  04/22/19 183 lb 3.2 oz (83.1 kg)     GEN:  Well nourished, well developed in no acute distress HEENT: Normal NECK: No JVD; No carotid bruits LYMPHATICS: No lymphadenopathy CARDIAC: RRR, 2/6 harsh mid peaking systolic murmur at the right upper sternal border RESPIRATORY:  Clear to auscultation without rales, wheezing or rhonchi  ABDOMEN: Soft, non-tender, non-distended MUSCULOSKELETAL:  No edema; No deformity  SKIN: Warm and dry NEUROLOGIC:  Alert and oriented x 3 PSYCHIATRIC:  Normal affect   ASSESSMENT:    1. Bicuspid aortic valve   2. Dilated aortic root ([place])   3. Coronary artery disease involving native coronary artery of native heart without angina pectoris   4. Pure hypercholesterolemia   5. Pre-procedural cardiovascular examination    PLAN:    In order of problems listed above:  The patient has moderate bicuspid aortic valve stenosis (stage C disease) associated with at least a mildly dilated ascending aorta.  I reviewed available data today, which include an echocardiogram from last year and a CT scan from 2020.  His echo shows moderate aortic stenosis with a mean transvalvular gradient of 23 mmHg.  The patient appears to  have a bicuspid valve morphology.  LV function and RV function are normal and there is no other significant valvular disease.  His CT scan from 2020 demonstrated a 4.1 cm ascending aorta and the echocardiogram performed last year measured a 4 0.5 cm ascending aorta.  I have recommended a gated cardiac CTA (TAVR protocol) to better evaluate aortic valve morphology and also provide an accurate and updated measurement for the size/maximal diameter of the ascending aorta.  The patient will also have an echocardiogram to assess for changes in aortic stenosis severity over the past year.  Based on his exam, I suspect he continues to have moderate aortic stenosis. As above, check CTA to reassess ascending aortic size No typical symptoms of angina.  Continue medical therapy.  Patient treated with aspirin and atorvastatin. Treated with atorvastatin.  Goal LDL cholesterol is less than 70.     Overall the patient appears clinically stable with NYHA functional class I symptoms.  As long as his echo and CTA demonstrate stable findings, I plan to see him back in 1 year for follow-up evaluation.  We discussed the natural history of bicuspid aortic valve stenosis today as well as potential treatment options in the future as the aortic valve replacement will likely be indicated at some point.    Medication Adjustments/Labs and Tests Ordered: Current medicines are reviewed at length with the patient today.  Concerns regarding medicines are outlined above.  Orders Placed This Encounter  Procedures   CT ANGIO CHEST AORTA W/ & OR WO/CM & GATING ([place] ONLY)   Basic metabolic panel   EKG [id number]   ECHOCARDIOGRAM COMPLETE   No orders of the defined types were placed in this encounter.   Patient Instructions  Medication Instructions:  NONE *If you need a refill on your cardiac medications before your next appointment, please call your pharmacy*   Lab Work: BMET today If you have labs (blood work) drawn  today and your tests are completely normal, you will receive your results only by: [place] (if you have MyChart) OR A paper copy in the mail If you have any lab test that is abnormal or we need to change your treatment, we will call you to review the results.   Testing/Procedures: ECHO  Your physician has requested that you have an echocardiogram. Echocardiography is a painless test that uses sound waves to create images of your heart. It provides your doctor with information about the size and shape of your heart and how well your heart's chambers and valves are working. This procedure takes approximately one hour. There are no restrictions for this procedure.  CTA chest/Aorta Your physician has requested that you have cardiac CT. Cardiac computed tomography (CT) is a painless test that uses an x-ray machine to take clear, detailed pictures of your heart. For further information please visit [email or web address]. Please follow instruction sheet as given.  Follow-Up: At [facility], you and your health needs are our priority.  As part of our continuing mission to provide you with exceptional heart care, we have created designated Provider Care Teams.  These Care Teams include your primary Cardiologist (physician) and Advanced Practice Providers (APPs -  Physician Assistants and Nurse Practitioners) who all work together to provide you with the care you need, when you need it.  We recommend signing up for the patient portal called "MyChart".  Sign up information is provided on this After Visit Summary.  MyChart is used to connect with patients for Virtual Visits (Telemedicine).  Patients are able to view lab/test results, encounter notes, upcoming appointments, etc.  Non-urgent messages can be sent to your provider as well.   To learn more about what you can do with MyChart, go to [email or web address].    Your next appointment:   1 year(s)  The format for your next  appointment:   In Person  Provider:   Legrand Como Merridy Pascoe,MD    Important Information About Sugar         Signed, Sherren Mocha, MD  05/08/2022 1:46 PM    [place]

## 2022-05-02 LAB — BASIC METABOLIC PANEL
BUN/Creatinine Ratio: 14 (ref 10–24)
BUN: 12 mg/dL (ref 8–27)
CO2: 25 mmol/L (ref 20–29)
Calcium: 9.6 mg/dL (ref 8.6–10.2)
Chloride: 101 mmol/L (ref 96–106)
Creatinine, Ser: 0.85 mg/dL (ref 0.76–1.27)
Glucose: 81 mg/dL (ref 70–99)
Potassium: 4.9 mmol/L (ref 3.5–5.2)
Sodium: 139 mmol/L (ref 134–144)
eGFR: 96 mL/min/{1.73_m2} (ref >59)

## 2022-05-08 ENCOUNTER — Ambulatory Visit (HOSPITAL_COMMUNITY)
Admission: RE | Admit: 2022-05-08 | Discharge: 2022-05-08 | Disposition: A | Discharge status: Home / Self Care | Payer: No Typology Code available for payment source | Source: Ambulatory Visit | Attending: Cardiovascular Disease | Admitting: Cardiovascular Disease

## 2022-05-08 DIAGNOSIS — E7800 Pure hypercholesterolemia, unspecified: Secondary | ICD-10-CM | POA: Insufficient documentation

## 2022-05-08 DIAGNOSIS — I2510 Atherosclerotic heart disease of native coronary artery without angina pectoris: Secondary | ICD-10-CM | POA: Insufficient documentation

## 2022-05-08 DIAGNOSIS — Z01810 Encounter for preprocedural cardiovascular examination: Secondary | ICD-10-CM | POA: Diagnosis present

## 2022-05-08 DIAGNOSIS — I77810 Thoracic aortic ectasia: Secondary | ICD-10-CM | POA: Insufficient documentation

## 2022-05-08 DIAGNOSIS — Q231 Congenital insufficiency of aortic valve: Secondary | ICD-10-CM | POA: Insufficient documentation

## 2022-05-08 MED ORDER — IOHEXOL 350 MG/ML SOLN
75.0000 mL | Freq: Once | INTRAVENOUS | Status: AC | PRN
  Administered 2022-05-08: 75 mL via INTRAVENOUS

## 2022-05-11 ENCOUNTER — Other Ambulatory Visit: Payer: Self-pay | Admitting: Internal Medicine

## 2022-05-11 DIAGNOSIS — R9389 Abnormal findings on diagnostic imaging of other specified body structures: Secondary | ICD-10-CM

## 2022-05-22 ENCOUNTER — Other Ambulatory Visit (HOSPITAL_COMMUNITY): Payer: Medicare Other

## 2022-05-29 ENCOUNTER — Ambulatory Visit (HOSPITAL_COMMUNITY): Payer: No Typology Code available for payment source | Attending: Cardiovascular Disease

## 2022-05-29 DIAGNOSIS — I2510 Atherosclerotic heart disease of native coronary artery without angina pectoris: Secondary | ICD-10-CM

## 2022-05-29 DIAGNOSIS — Q2381 Bicuspid aortic valve: Secondary | ICD-10-CM

## 2022-05-29 DIAGNOSIS — Q231 Congenital insufficiency of aortic valve: Secondary | ICD-10-CM | POA: Diagnosis not present

## 2022-05-29 DIAGNOSIS — I77810 Thoracic aortic ectasia: Secondary | ICD-10-CM

## 2022-05-29 DIAGNOSIS — Z01810 Encounter for preprocedural cardiovascular examination: Secondary | ICD-10-CM

## 2022-05-29 DIAGNOSIS — E7800 Pure hypercholesterolemia, unspecified: Secondary | ICD-10-CM

## 2022-05-29 LAB — ECHOCARDIOGRAM COMPLETE
AR max vel: 1.7 cm2
AV Area VTI: 1.69 cm2
AV Area mean vel: 1.65 cm2
AV Mean grad: 35.7 mmHg
AV Peak grad: 61.5 mmHg
Ao pk vel: 3.92 m/s
Area-P 1/2: 3.08 cm2
P 1/2 time: 404 msec
S' Lateral: 2.7 cm

## 2022-06-28 ENCOUNTER — Other Ambulatory Visit: Payer: No Typology Code available for payment source

## 2022-07-27 ENCOUNTER — Ambulatory Visit
Admission: RE | Admit: 2022-07-27 | Discharge: 2022-07-27 | Disposition: A | Discharge status: Home / Self Care | Payer: Medicare Other | Source: Ambulatory Visit | Attending: Internal Medicine | Admitting: Internal Medicine

## 2022-07-27 DIAGNOSIS — R9389 Abnormal findings on diagnostic imaging of other specified body structures: Secondary | ICD-10-CM

## 2022-12-04 DIAGNOSIS — R9389 Abnormal findings on diagnostic imaging of other specified body structures: Secondary | ICD-10-CM | POA: Insufficient documentation

## 2023-05-13 ENCOUNTER — Encounter: Payer: Self-pay | Admitting: Cardiovascular Disease

## 2023-05-13 ENCOUNTER — Ambulatory Visit: Payer: Medicare Other | Attending: Cardiovascular Disease | Admitting: Cardiovascular Disease

## 2023-05-13 VITALS — BP 130/80 | HR 65 | Ht 73.0 in | Wt 194.4 lb

## 2023-05-13 DIAGNOSIS — I2510 Atherosclerotic heart disease of native coronary artery without angina pectoris: Secondary | ICD-10-CM | POA: Diagnosis not present

## 2023-05-13 DIAGNOSIS — I350 Nonrheumatic aortic (valve) stenosis: Secondary | ICD-10-CM

## 2023-05-13 NOTE — Patient Instructions (Signed)
Medication Instructions:  Your physician recommends that you continue on your current medications as directed. Please refer to the Current Medication list given to you today.  *If you need a refill on your cardiac medications before your next appointment, please call your pharmacy*  Testing/Procedures: ECHO Your physician has requested that you have an echocardiogram. Echocardiography is a painless test that uses sound waves to create images of your heart. It provides your doctor with information about the size and shape of your heart and how well your heart's chambers and valves are working. This procedure takes approximately one hour. There are no restrictions for this procedure. Please do NOT wear cologne, perfume, aftershave, or lotions (deodorant is allowed). Please arrive 15 minutes prior to your appointment time.  Follow-Up: At [facility], you and your health needs are our priority.  As part of our continuing mission to provide you with exceptional heart care, we have created designated Provider Care Teams.  These Care Teams include your primary Cardiologist (physician) and Advanced Practice Providers (APPs -  Physician Assistants and Nurse Practitioners) who all work together to provide you with the care you need, when you need it.  Your next appointment:   1 year(s)  Provider:   Tonny Bollman, MD

## 2023-05-13 NOTE — Assessment & Plan Note (Signed)
Based on moderate coronary artery calcification seen on CT scan.  No history of heart catheterization.  No anginal symptoms.  Continue aspirin and statin drug.

## 2023-05-13 NOTE — Progress Notes (Signed)
Cardiology Office Note:    Date:  05/13/2023   ID:  Micheal Powell, DOB 1956-01-05, MRN [id number]  PCP:  Emilio Aspen, MD   [facility] Providers Cardiologist:  Tonny Bollman, MD     Referring MD: Emilio Aspen, *   Chief Complaint  Patient presents with   Follow-up    Aortic stenosis    History of Present Illness:    Micheal Powell is a 67 y.o. male presenting for follow-up of bicuspid aortic valve stenosis and dilated aortic root.  The patient was last seen 1 year ago.  His most recent echo shows normal LVEF of 60 to 65%, mean transaortic gradient of 23 mmHg, and low dimensionless index of 0.25.  He was asymptomatic when I saw him and we elected for continued surveillance.  A CTA of the chest showed a 4.2 cm ascending aorta.  The patient is here alone today.  He is able to walk for miles on a regular basis without exertional symptoms.  He denies chest pain, chest pressure, or shortness of breath.  No lightheadedness or heart palpitations.  His wife is finding ovarian cancer.  They recently celebrated the birth of their first grandchild.   Current Medications: Current Meds  Medication Sig   aspirin EC 81 MG tablet Take 81 mg by mouth daily.   atorvastatin (LIPITOR) 10 MG tablet Take 10 mg by mouth daily.   augmented betamethasone dipropionate (DIPROLENE-AF) 0.05 % ointment Apply topically as needed.   cetirizine (ZYRTEC ALLERGY) 10 MG tablet 1 tablet Orally once a day if needed   desonide (DESOWEN) 0.05 % ointment 2 (two) times daily as needed.   finasteride (PROPECIA) 1 MG tablet Take 1 mg by mouth daily.   Multiple Vitamin (MULTIVITAMIN) tablet Take 1 tablet by mouth daily.   Multiple Vitamins-Minerals (AIRBORNE PO) Take by mouth.   mupirocin ointment (BACTROBAN) 2 % Apply 1 Application topically 2 (two) times daily.   triamcinolone cream (KENALOG) 0.1 % Apply 1 Application topically as needed.     Allergies:   Patient has no known allergies.    ROS:   Please see the history of present illness.    All other systems reviewed and are negative.  EKGs/Labs/Other Studies Reviewed:    The following studies were reviewed today: Cardiac Studies & Procedures       ECHOCARDIOGRAM  ECHOCARDIOGRAM COMPLETE 05/29/2022  Narrative ECHOCARDIOGRAM REPORT    Patient Name:   Micheal Powell  Date of Exam: 05/29/2022 Medical Rec #:  [id number]     Height:       73.0 in Accession #:    [id number]    Weight:       191.4 lb Date of Birth:  1956-03-20      BSA:          2.112 m Patient Age:    66 years      BP:           130/78 mmHg Patient Gender: M             HR:           73 bpm. Exam Location:  [address]  Procedure: 2D Echo, 3D Echo, Cardiac Doppler, Color Doppler and Strain Analysis  Indications:    Q23.1 Bicuspid aortic valve  History:        Patient has prior history of Echocardiogram examinations, most recent 06/01/2021. CAD; Risk Factors:Dyslipidemia. Dilated aortic root. Bicuspid aortic valve.  Sonographer:    Jorje Guild  BS, RDCS Referring Phys: [id number] Ada Holness  IMPRESSIONS   1. Left ventricular ejection fraction by 3D volume is 68 %. The left ventricle has normal function. The left ventricle has no regional wall motion abnormalities. There is mild concentric left ventricular hypertrophy. Left ventricular diastolic parameters are consistent with Grade I diastolic dysfunction (impaired relaxation). The average left ventricular global longitudinal strain is -24.7 %. The global longitudinal strain is normal. 2. Right ventricular systolic function is normal. The right ventricular size is normal. 3. The mitral valve is normal in structure. No evidence of mitral valve regurgitation. No evidence of mitral stenosis. 4. The aortic valve is calcified. Aortic valve regurgitation is mild. Moderate aortic valve stenosis. Aortic regurgitation PHT measures 404 msec. Aortic valve mean gradient measures 35.7 mmHg. Aortic valve Vmax  measures 3.92 m/s. 5. There is mild dilatation of the ascending aorta, measuring 38 mm. There is moderate dilatation of the ascending aorta, measuring 43 mm. 6. The inferior vena cava is normal in size with greater than 50% respiratory variability, suggesting right atrial pressure of 3 mmHg.  Comparison(s): 11/0/22 EF 60-65%. GLS -16.6%. Moderate-severe AS mean PG, peak PG. Mild AI.  FINDINGS Left Ventricle: Left ventricular ejection fraction by 3D volume is 68 %. The left ventricle has normal function. The left ventricle has no regional wall motion abnormalities. The average left ventricular global longitudinal strain is -24.7 %. The global longitudinal strain is normal. The left ventricular internal cavity size was normal in size. There is mild concentric left ventricular hypertrophy. Left ventricular diastolic parameters are consistent with Grade I diastolic dysfunction (impaired relaxation).  Right Ventricle: The right ventricular size is normal. No increase in right ventricular wall thickness. Right ventricular systolic function is normal.  Left Atrium: Left atrial size was normal in size.  Right Atrium: Right atrial size was normal in size.  Pericardium: There is no evidence of pericardial effusion.  Mitral Valve: The mitral valve is normal in structure. No evidence of mitral valve regurgitation. No evidence of mitral valve stenosis.  Tricuspid Valve: The tricuspid valve is normal in structure. Tricuspid valve regurgitation is mild . No evidence of tricuspid stenosis.  Aortic Valve: The aortic valve is calcified. Aortic valve regurgitation is mild. Aortic regurgitation PHT measures 404 msec. Moderate aortic stenosis is present. Aortic valve mean gradient measures 35.7 mmHg. Aortic valve peak gradient measures 61.5 mmHg. Aortic valve area, by VTI measures 1.69 cm.  Pulmonic Valve: The pulmonic valve was not well visualized. Pulmonic valve regurgitation is not visualized.  No evidence of pulmonic stenosis.  Aorta: There is mild dilatation of the ascending aorta, measuring 38 mm. There is moderate dilatation of the ascending aorta, measuring 43 mm.  Venous: The inferior vena cava is normal in size with greater than 50% respiratory variability, suggesting right atrial pressure of 3 mmHg.  IAS/Shunts: No atrial level shunt detected by color flow Doppler.   LEFT VENTRICLE PLAX 2D LVIDd:         4.80 cm         Diastology LVIDs:         2.70 cm         LV e' medial:    7.80 cm/s LV PW:         1.10 cm         LV E/e' medial:  6.0 LV IVS:        1.10 cm         LV e' lateral:   9.15  cm/s LVOT diam:     2.50 cm         LV E/e' lateral: 5.1 LV SV:         149 LV SV Index:   70              2D LVOT Area:     4.91 cm        Longitudinal Strain 2D Strain GLS  -20.6 % (A2C): 2D Strain GLS  -27.7 % (A3C): 2D Strain GLS  -25.8 % (A4C): 2D Strain GLS  -24.7 % Avg:  3D Volume EF LV 3D EF:    Left ventricul ar ejection fraction by 3D volume is 68 %.  3D Volume EF: 3D EF:        68 % LV EDV:       107 ml LV ESV:       34 ml LV SV:        72 ml  RIGHT VENTRICLE             IVC RV Basal diam:  4.30 cm     IVC diam: 1.80 cm RV S prime:     15.20 cm/s TAPSE (M-mode): 3.0 cm  LEFT ATRIUM             Index        RIGHT ATRIUM           Index LA diam:        4.50 cm 2.13 cm/m   RA Pressure: 3.00 mmHg LA Vol (A2C):   30.5 ml 14.44 ml/m  RA Area:     19.00 cm LA Vol (A4C):   41.0 ml 19.41 ml/m  RA Volume:   55.60 ml  26.33 ml/m LA Biplane Vol: 37.3 ml 17.66 ml/m AORTIC VALVE AV Area (Vmax):    1.70 cm AV Area (Vmean):   1.65 cm AV Area (VTI):     1.69 cm AV Vmax:           392.00 cm/s AV Vmean:          278.333 cm/s AV VTI:            0.881 m AV Peak Grad:      61.5 mmHg AV Mean Grad:      35.7 mmHg LVOT Vmax:         136.00 cm/s LVOT Vmean:        93.800 cm/s LVOT VTI:          0.303 m LVOT/AV VTI ratio: 0.34 AI PHT:            404  msec  AORTA Ao Root diam: 3.80 cm Ao Asc diam:  4.30 cm  MITRAL VALVE               TRICUSPID VALVE Estimated RAP:  3.00 mmHg MV Decel Time: 246 msec MV E velocity: 47.00 cm/s  SHUNTS MV A velocity: 67.70 cm/s  Systemic VTI:  0.30 m MV E/A ratio:  0.69        Systemic Diam: 2.50 cm  Kardie Tobb DO Electronically signed by Thomasene Ripple DO Signature Date/Time: 05/29/2022/1:43:36 PM    Final     CT SCANS  CT CARDIAC SCORING (SELF PAY ONLY) 02/09/2019  Narrative CLINICAL DATA:  Hypercholesterolemia  EXAM: CT HEART FOR CALCIUM SCORING  TECHNIQUE: CT heart was performed using prospective ECG gating.  A non-contrast exam for calcium scoring was performed.  Note that this exam targets the heart and the  chest was not imaged in its entirety.  COMPARISON:  None.  FINDINGS: Technical quality: Good.  CORONARY CALCIUM  Total Agatston Score: 73.5 with calcifications in the left anterior descending, left circumflex and right coronary arteries.  MESA database percentile:  34  OTHER FINDINGS:  Cardiovascular: Aortic valve calcifications noted. Heart is normal size. Ascending aorta mildly aneurysmal at 4.1 cm.  Mediastinum/Nodes: No mediastinal, hilar, or axillary adenopathy.  Lungs/Pleura: Visualized lungs clear.  No effusions.  Upper Abdomen: Imaging into the upper abdomen shows no acute findings.  Musculoskeletal: Chest wall soft tissues are unremarkable. No acute bony abnormality.  IMPRESSION: The observed calcium score of 73.5 is at the percentile 57 for subjects of the same age, gender and race/ethnicity who are free of clinical cardiovascular disease and treated diabetes.  Aortic valve calcifications.  4.1 cm ascending thoracic aortic aneurysm. Recommend annual imaging followup by CTA or MRA. This recommendation follows 2010 ACCF/AHA/AATS/ACR/ASA/SCA/SCAI/SIR/STS/SVM Guidelines for the Diagnosis and Management of Patients with Thoracic Aortic  Disease. Circulation. 2010; 121: Z610-R604. Aortic aneurysm NOS (ICD10-I71.9)   Electronically Signed By: Charlett Nose M.D. On: 02/09/2019 10:12          EKG:   EKG Interpretation Date/Time:  Monday May 13 2023 08:22:10 EDT Ventricular Rate:  66 PR Interval:  190 QRS Duration:  88 QT Interval:  368 QTC Calculation: 385 R Axis:   32  Text Interpretation: Normal sinus rhythm Right atrial enlargement Minimal voltage criteria for LVH, may be normal variant ( Sokolow-Lyon ) No previous ECGs available Confirmed by Tonny Bollman [phone]) on 05/13/2023 8:38:39 AM    Recent Labs: No results found for requested labs within last 365 days.  Recent Lipid Panel No results found for: "CHOL", "TRIG", "HDL", "CHOLHDL", "VLDL", "LDLCALC", "LDLDIRECT"   Risk Assessment/Calculations:                Physical Exam:    VS:  BP 130/80   Pulse 65   Ht 6\' 1"  (1.854 m)   Wt 194 lb 6.4 oz (88.2 kg)   SpO2 95%   BMI 25.65 kg/m     Wt Readings from Last 3 Encounters:  05/13/23 194 lb 6.4 oz (88.2 kg)  05/01/22 191 lb 6.4 oz (86.8 kg)  05/24/21 190 lb (86.2 kg)     GEN:  Well nourished, well developed in no acute distress HEENT: Normal NECK: No JVD; No carotid bruits LYMPHATICS: No lymphadenopathy CARDIAC: RRR, 3/6 systolic murmur at the right upper sternal border mid-to-late peaking, preserved A2 RESPIRATORY:  Clear to auscultation without rales, wheezing or rhonchi  ABDOMEN: Soft, non-tender, non-distended MUSCULOSKELETAL:  No edema; No deformity  SKIN: Warm and dry NEUROLOGIC:  Alert and oriented x 3 PSYCHIATRIC:  Normal affect   Assessment & Plan Coronary artery disease involving native coronary artery of native heart without angina pectoris Based on moderate coronary artery calcification seen on CT scan.  No history of heart catheterization.  No anginal symptoms.  Continue aspirin and statin drug. Nonrheumatic aortic valve stenosis The patient appears to have stable,  stage C aortic stenosis, moderately severe based on exam.  I think he should have an updated echocardiogram as it has been about 1 year since his last study.  We discussed potential symptoms of aortic stenosis and he will keep an eye out for these.  He should have a repeat CT scan for aortic surveillance next year.  I will plan to see him back in 1 year for follow-up evaluation.  Medication Adjustments/Labs and Tests Ordered: Current medicines are reviewed at length with the patient today.  Concerns regarding medicines are outlined above.  Orders Placed This Encounter  Procedures   EKG 12-Lead   ECHOCARDIOGRAM COMPLETE   No orders of the defined types were placed in this encounter.   Patient Instructions  Medication Instructions:  Your physician recommends that you continue on your current medications as directed. Please refer to the Current Medication list given to you today.  *If you need a refill on your cardiac medications before your next appointment, please call your pharmacy*  Testing/Procedures: ECHO Your physician has requested that you have an echocardiogram. Echocardiography is a painless test that uses sound waves to create images of your heart. It provides your doctor with information about the size and shape of your heart and how well your heart's chambers and valves are working. This procedure takes approximately one hour. There are no restrictions for this procedure. Please do NOT wear cologne, perfume, aftershave, or lotions (deodorant is allowed). Please arrive 15 minutes prior to your appointment time.  Follow-Up: At [facility], you and your health needs are our priority.  As part of our continuing mission to provide you with exceptional heart care, we have created designated Provider Care Teams.  These Care Teams include your primary Cardiologist (physician) and Advanced Practice Providers (APPs -  Physician Assistants and Nurse Practitioners) who all work  together to provide you with the care you need, when you need it.  Your next appointment:   1 year(s)  Provider:   Tonny Bollman, MD        Signed, Tonny Bollman, MD  05/13/2023 1:15 PM    [facility]

## 2023-05-31 ENCOUNTER — Other Ambulatory Visit (HOSPITAL_COMMUNITY): Payer: Medicare Other

## 2023-06-14 ENCOUNTER — Ambulatory Visit (HOSPITAL_COMMUNITY): Payer: Medicare Other | Attending: Cardiovascular Disease

## 2023-06-14 DIAGNOSIS — I350 Nonrheumatic aortic (valve) stenosis: Secondary | ICD-10-CM | POA: Diagnosis not present

## 2023-06-14 LAB — ECHOCARDIOGRAM COMPLETE
AR max vel: 0.96 cm2
AV Area VTI: 0.98 cm2
AV Area mean vel: 0.9 cm2
AV Mean grad: 40.5 mm[Hg]
AV Peak grad: 69.6 mm[Hg]
AV Vena cont: 0.3 cm
Ao pk vel: 4.17 m/s
Area-P 1/2: 2.5 cm2
P 1/2 time: 477 ms
S' Lateral: 3.4 cm

## 2023-10-25 ENCOUNTER — Encounter: Payer: Self-pay | Admitting: Cardiovascular Disease

## 2023-11-25 ENCOUNTER — Ambulatory Visit: Payer: Medicare Other | Admitting: Cardiovascular Disease

## 2024-01-16 ENCOUNTER — Ambulatory Visit: Payer: Medicare Other | Admitting: Cardiovascular Disease

## 2024-07-27 DIAGNOSIS — I350 Nonrheumatic aortic (valve) stenosis: Secondary | ICD-10-CM | POA: Insufficient documentation

## 2024-08-17 DIAGNOSIS — C61 Malignant neoplasm of prostate: Secondary | ICD-10-CM | POA: Insufficient documentation

## 2024-08-20 ENCOUNTER — Other Ambulatory Visit: Payer: Self-pay

## 2024-08-20 ENCOUNTER — Inpatient Hospital Stay
Admission: RE | Admit: 2024-08-20 | Discharge: 2024-08-20 | Disposition: A | Discharge status: Home / Self Care | Payer: Self-pay | Source: Ambulatory Visit | Attending: Radiation Oncology | Admitting: Radiation Oncology

## 2024-08-20 DIAGNOSIS — C61 Malignant neoplasm of prostate: Secondary | ICD-10-CM

## 2024-08-20 NOTE — Progress Notes (Signed)
 GU Location of Tumor / Histology: Prostate Ca  If Prostate Cancer, Gleason Score is (4 + 4) and PSA is (5.42 on 08/17/2024)  Lynwood Areola presented as referral from Dr. Morene MICAEL Salines [facility]) elevated PSA.  Biopsies     07/21/2024 Dr. 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 CLINICAL DATA:  69 years Male Prostate cancer, staging, R97.20 Elevated prostate specific antigen (PSA) Radiotracer: 9.2 mCi [id number] (piflufolastat, Pylarify), intravenously.  IMPRESSION: 1. Intense PSMA tracer activity centered within the right prostate, concerning for primary malignancy. Suspected involvement of proximal aspect of right seminal vesicle. Please see MRI for detailed evaluation of local disease extent. 2. No evidence of PSMA tracer avid metastatic disease. 3. Area of suspected rounded atelectasis at right lung base, similar to CT chest from January 2024. Minimal associated PSMA tracer uptake is favored inflammatory.   Past/Anticipated interventions by urology, if any:  Dr. Ozell Dorise Qualia   Past/Anticipated interventions by medical oncology, if any:  N/A  Weight changes, if any:  No  IPSS:  14 SHIM:  0  Widower wife passed 08/12/2023  (no sexual activity since 06/2021)  Bowel/Bladder complaints, if any:  Urinary frequency/urgency, occ.dribbling.  Nausea/Vomiting, if any: No  Pain issues, if any:  0/10  SAFETY ISSUES: Prior radiation?  No Pacemaker/ICD? No Possible current pregnancy? Male Is the patient on methotrexate?  No  Current Complaints / other details:  Titanium implants in mouth and neck.  30 minutes spent total, including time for meaningful use questions, reviewing medication, as well as spent in face-to-face time in nurse evaluation with the patient.

## 2024-08-20 NOTE — Progress Notes (Signed)
 " Radiation Oncology         [phone] ________________________________  Initial Outpatient Consultation  Name: Micheal Powell MRN: [id number]  Date: 08/21/2024  DOB: [birth year]  RR:[email or web address], Dorn LABOR, MD  Cam Morene ORN, MD   REFERRING PHYSICIAN: Selma Cough, MD  DIAGNOSIS: 69 y.o. gentleman with Stage T3a adenocarcinoma of the prostate with Gleason score of 4+4, and PSA of 3.08 (6.2 adjusted for finasteride).     ICD-10-CM   1. Malignant neoplasm of prostate (HCC)  C61       HISTORY OF PRESENT ILLNESS: Micheal Powell is a 69 y.o. male with a diagnosis of prostate cancer. He was noted to have an elevated PSA of 3.08 by his primary care physician, Dr. Charlott, Dorn LABOR, MD.  He has been on Propecia for many years so PSA adjusted for finasteride is currently 6.2. His previous PSA in 01/2023 was 1.44 (2.88 adjusted for finasteride). Accordingly, he was referred for evaluation in urology by Sharilyn Gartner, NP, on 05/21/2024.  A prostate MRI was performed 07/07/2024 and showed 2 PI-RADS 5 lesions in the right anterior and right posterior peripheral zone with suspicion of extracapsular extension and likely neurovascular bundle involvement on the right.  He had a PSMA PET scan on 07/21/2024 which showed intense activity with a dominant intraprostatic lesion in the right hemi prostate with likely involvement of the proximal right seminal vesicle, but no evidence of metastatic disease.  He met with Dr. Ivery on 08/06/2024 and underwent an MRI fusion transrectal ultrasound with 19 biopsies of the prostate.  The prostate volume measured 40 cc.  Out of 19 core biopsies, 16 were positive.  The maximum Gleason score was 4+4, and this was seen in 15 of 16 positive cores including all 7 samples from the MRI ROIs.  Additionally, Gleason 4+3 was seen in the right base lateral.       The patient reviewed the biopsy and imaging results with his urologist and given his medical comorbidities with  aortic stenosis and abdominal aortic aneurysm, anesthesiology and cardiology deem him to be a high risk surgical patient.  He is a close family friend of Dr. Adina Selma and prefers to have treatment locally since he lives in [place].  Therefore, he has kindly been referred to us  today for discussion of potential radiation treatment options.  PREVIOUS RADIATION THERAPY: No  PAST MEDICAL HISTORY:  Past Medical History:  Diagnosis Date   Abdominal pain, left lower quadrant    Anxiety    Aortic stenosis    Arthritis    Ascending aortic aneurysm    dilation'   Depression    Dyslipidemia    Dysplasia of hip    Frontal headache    Hair loss    Hypercholesterolemia    Neck pain on left side    Nocturia    Tubular adenoma       PAST SURGICAL HISTORY: Past Surgical History:  Procedure Laterality Date   CERVICAL FUSION     dental implant     RHINOPLASTY     SEPTOPLASTY     skin lump removal     TONSILLECTOMY     WISDOM TOOTH EXTRACTION      FAMILY HISTORY:  Family History  Problem Relation Age of Onset   Depression Mother    Osteoporosis Mother    Heart Problems Mother    Sinusitis Mother    Post-traumatic stress disorder Mother    Other Father        accidental  Alcoholism Father    Other Sister        DJD, HIP RESURFACING   Other Brother        DJD MINESTROKE, CEREBRAL ANEURYSM    SOCIAL HISTORY:  Social History   Socioeconomic History   Marital status: Married    Spouse name: Not on file   Number of children: Not on file   Years of education: Not on file   Highest education level: Not on file  Occupational History   Not on file  Tobacco Use   Smoking status: Never   Smokeless tobacco: Never  Substance and Sexual Activity   Alcohol use: Not on file   Drug use: Not on file   Sexual activity: Not on file  Other Topics Concern   Not on file  Social History Narrative   Not on file   Social Drivers of Health   Tobacco Use: Medium Risk (08/06/2024)    Received from [facility]   Patient History    Smoking Tobacco Use: Former    Smokeless Tobacco Use: Never    Passive Exposure: Not on Actuary Strain: Not on file  Food Insecurity: Not on file  Transportation Needs: Not on file  Physical Activity: Not on file  Stress: Not on file  Social Connections: Not on file  Intimate Partner Violence: Not on file  Depression (EYV7-0): Not on file  Alcohol Screen: Not on file  Housing: Unknown (10/16/2023)   Received from [facility]   Epic    Unable to Pay for Housing in the Last Year: Not on file    Number of Times Moved in the Last Year: Not on file    At any time in the past 12 months, were you homeless or living in a shelter (including now)?: No  Utilities: Not on file  Health Literacy: Not on file    ALLERGIES: Patient has no known allergies.  MEDICATIONS:  Current Outpatient Medications  Medication Sig Dispense Refill   aspirin EC 81 MG tablet Take 81 mg by mouth daily.     atorvastatin (LIPITOR) 10 MG tablet Take 10 mg by mouth daily.     augmented betamethasone  dipropionate (DIPROLENE -AF) 0.05 % ointment Apply topically as needed.     cetirizine (ZYRTEC ALLERGY) 10 MG tablet 1 tablet Orally once a day if needed     desonide (DESOWEN) 0.05 % ointment 2 (two) times daily as needed.     finasteride (PROPECIA) 1 MG tablet Take 1 mg by mouth daily.     Multiple Vitamin (MULTIVITAMIN) tablet Take 1 tablet by mouth daily.     Multiple Vitamins-Minerals (AIRBORNE PO) Take by mouth.     mupirocin ointment (BACTROBAN) 2 % Apply 1 Application topically 2 (two) times daily.     triamcinolone cream (KENALOG) 0.1 % Apply 1 Application topically as needed.     No current facility-administered medications for this encounter.    REVIEW OF SYSTEMS:  On review of systems, the patient reports that he is doing well overall. He denies any chest pain, shortness of breath, cough, fevers, chills,  night sweats, unintended weight changes. He denies any bowel disturbances, and denies abdominal pain, nausea or vomiting. He denies any new musculoskeletal or joint aches or pains. His IPSS was 14, indicating moderate urinary symptoms. His SHIM was 0- he is not currently sexually active since the time of his wife's passing in 07/2023. A complete review of systems is obtained and is  otherwise negative.    PHYSICAL EXAM:  Wt Readings from Last 3 Encounters:  08/21/24 183 lb 3.2 oz (83.1 kg)  05/13/23 194 lb 6.4 oz (88.2 kg)  05/01/22 191 lb 6.4 oz (86.8 kg)   Temp Readings from Last 3 Encounters:  08/21/24 (!) 97.5 F (36.4 C)  10/21/14 97.7 F (36.5 C) (Oral)  12/02/13 98.6 F (37 C) (Oral)   BP Readings from Last 3 Encounters:  08/21/24 133/85  05/13/23 130/80  05/01/22 130/78   Pulse Readings from Last 3 Encounters:  08/21/24 70  05/13/23 65  05/01/22 77    /10  In general this is a well appearing caucasian male in no acute distress. He's alert and oriented x4 and appropriate throughout the examination. Cardiopulmonary assessment is negative for acute distress, and he exhibits normal effort.     KPS = 100  100 - Normal; no complaints; no evidence of disease. 90   - Able to carry on normal activity; minor signs or symptoms of disease. 80   - Normal activity with effort; some signs or symptoms of disease. 61   - Cares for self; unable to carry on normal activity or to do active work. 60   - Requires occasional assistance, but is able to care for most of his personal needs. 50   - Requires considerable assistance and frequent medical care. 40   - Disabled; requires special care and assistance. 30   - Severely disabled; hospital admission is indicated although death not imminent. 20   - Very sick; hospital admission necessary; active supportive treatment necessary. 10   - Moribund; fatal processes progressing rapidly. 0     - Dead  Karnofsky DA, Abelmann WH, Craver LS and  Burchenal [facility] [phone]) The use of the nitrogen mustards in the palliative treatment of carcinoma: with particular reference to bronchogenic carcinoma Cancer 1 634-56  LABORATORY DATA:  Lab Results  Component Value Date   WBC 21.1 (H) 05/09/2007   HGB 15.6 05/09/2007   HCT 45.7 05/09/2007   MCV 90.4 05/09/2007   PLT 393 05/09/2007   Lab Results  Component Value Date   NA 139 05/01/2022   K 4.9 05/01/2022   CL 101 05/01/2022   CO2 25 05/01/2022   No results found for: ALT, AST, GGT, ALKPHOS, BILITOT   RADIOGRAPHY: No results found.    IMPRESSION/PLAN: 1. 69 y/o man with Stage T3a adenocarcinoma of the prostate with Gleason score of 4+4, and PSA of 3.08 (6.2 adjusted for finasteride). We discussed the patient's workup and outlined the nature of prostate cancer in this setting. The patient's T stage, Gleason's score, and PSA put him into the high risk group. Accordingly, he is eligible for a variety of potential treatment options including LT-ADT concurrent with either 8 weeks of external radiation, or 5 weeks of external radiation with an upfront brachytherapy boost, or prostatectomy. We discussed the available radiation techniques, and focused on the details and logistics of delivery. The patient is not an ideal candidate for brachytherapy boost given his underlying cardiac history that makes him a high risk surgical candidate. Therefore, we discussed and outlined the risks, benefits, short and long-term effects associated with daily external beam radiotherapy and compared and contrasted these with prostatectomy. We discussed the role of rectal spacer gel in reducing the rectal toxicity associated with radiotherapy. We also detailed the role of ADT in the treatment of high risk prostate cancer and outlined the associated side effects that could be expected with this  therapy.  He appears to have a good understanding of his disease and our treatment recommendations which are of curative  intent.  He was encouraged to ask questions that were answered to his stated satisfaction.  At the conclusion of our conversation, the patient appears to be leaning towards proceeding with LT-ADT concurrent with 8 weeks of daily external beam radiation here in [place] but has an upcoming appointment with the multidisciplinary urologic oncology group at [facility] on Tuesday, 08/25/24 with plans to make a final decision shortly therafter. He also has a scheduled consult visit with Dr. Selma on Thursday, 08/27/24 and will likely start ADT at that time. He does have a dominant intraprostatic lesion in the right hemi prostate with likely involvement of the proximal right seminal vesicle, that we would plan to boost with (SIB) during his course of treatment. He has our contact information and will let us  know once he reaches a final decision and if he elects to have treatments here in [place], we will coordinate for fiducial markers and rectal spacer gel placement in late 09/2024, in anticipation of beginning the daily radiation treatments the first week of 10/2024, approximately 8 weeks from the start of ADT. We truly enjoyed meeting him today and look forward to continuing to participate in his care.  We personally spent 70 minutes in this encounter including chart review, reviewing radiological studies, meeting face-to-face with the patient, entering orders and completing documentation.    Sabra MICAEL Rusk, PA-C    Donnice Barge, MD  [facility]  Radiation Oncology Direct Dial: [phone]  Fax: [phone] [email or web address]  Skype  [email or web address]  This document serves as a record of services personally performed by Donnice Barge, MD and Sabra Rusk, PA-C. It was created on their behalf by Damien Blanks, a trained medical scribe. The creation of this record is based on the scribe's personal observations and the provider's statements to them. This document has been checked and approved by the attending  provider. "

## 2024-08-21 ENCOUNTER — Ambulatory Visit
Admission: RE | Admit: 2024-08-21 | Discharge: 2024-08-21 | Disposition: A | Source: Ambulatory Visit | Attending: Radiation Oncology | Admitting: Radiation Oncology

## 2024-08-21 ENCOUNTER — Ambulatory Visit
Admission: RE | Admit: 2024-08-21 | Discharge: 2024-08-21 | Disposition: A | Source: Ambulatory Visit | Attending: Radiation Oncology

## 2024-08-21 ENCOUNTER — Encounter: Payer: Self-pay | Admitting: Radiation Oncology

## 2024-08-21 VITALS — BP 133/85 | HR 70 | Temp 97.5°F | Resp 20 | Ht 73.0 in | Wt 183.2 lb

## 2024-08-21 DIAGNOSIS — C61 Malignant neoplasm of prostate: Secondary | ICD-10-CM

## 2024-08-21 HISTORY — DX: Malignant neoplasm of prostate: C61

## 2024-08-21 HISTORY — DX: Elevated prostate specific antigen (PSA): R97.20

## 2024-08-24 NOTE — Progress Notes (Signed)
 Introduced myself to the patient as the prostate nurse navigator. He is here to discuss his radiation treatment options. I provided patient with some written education and my direct contact information.  Patient knows to reach out with any questions or barriers that may arise.    Patient will proceed with second opinion at [facility] on 2/3 and follow up with Dr. Selma on 2/5.  RN will follow up after these visits to finalize treatment decision.

## 2024-08-28 NOTE — Progress Notes (Signed)
 Patient met with [facility] on 08/25/24 and was started on Orgovyx and darolutamide.  Per consult notes at [facility] patient will plan to proceed with SBRT at [facility].  RN left message for call back to finalize treatment decision and ensure no additional needs.
